# Patient Record
Sex: Male | Born: 1935
Health system: Southern US, Community
[De-identification: ages and names within clinical notes are randomized; demographics above are authoritative.]

## PROBLEM LIST (undated history)

## (undated) DIAGNOSIS — G459 Transient cerebral ischemic attack, unspecified: Secondary | ICD-10-CM

## (undated) DIAGNOSIS — R569 Unspecified convulsions: Secondary | ICD-10-CM

## (undated) DIAGNOSIS — H9190 Unspecified hearing loss, unspecified ear: Secondary | ICD-10-CM

## (undated) DIAGNOSIS — E785 Hyperlipidemia, unspecified: Secondary | ICD-10-CM

## (undated) HISTORY — DX: Unspecified convulsions: R56.9

## (undated) HISTORY — DX: Unspecified hearing loss, unspecified ear: H91.90

## (undated) HISTORY — DX: Hyperlipidemia, unspecified: E78.5

---

## 2019-02-26 LAB — HEPATIC FUNCTION PANEL
ALT: 17 (ref 10–40)
AST: 20 (ref 14–40)
Alkaline Phosphatase: 91 (ref 25–125)
Bilirubin, Total: 0.6

## 2019-02-26 LAB — BASIC METABOLIC PANEL
BUN: 21 (ref 4–21)
CO2: 30 — AB (ref 13–22)
Chloride: 105 (ref 99–108)
Creatinine: 1.1 (ref 0.6–1.3)
Glucose: 99
Potassium: 4.4 (ref 3.4–5.3)
Sodium: 141 (ref 137–147)

## 2019-02-26 LAB — LIPID PANEL
Cholesterol: 152 (ref 0–200)
HDL: 76 — AB (ref 35–70)
LDL Cholesterol: 65
Triglycerides: 55 (ref 40–160)

## 2019-02-26 LAB — COMPREHENSIVE METABOLIC PANEL
Albumin: 4.8 (ref 3.5–5.0)
Calcium: 9.8 (ref 8.7–10.7)

## 2020-03-27 LAB — LIPID PANEL
Cholesterol: 180 (ref 0–200)
HDL: 76 — AB (ref 35–70)
LDL Cholesterol: 82
Triglycerides: 109 (ref 40–160)

## 2020-09-07 ENCOUNTER — Encounter: Payer: Self-pay | Admitting: Family Medicine

## 2020-09-07 ENCOUNTER — Other Ambulatory Visit: Payer: Self-pay

## 2020-09-07 ENCOUNTER — Ambulatory Visit (INDEPENDENT_AMBULATORY_CARE_PROVIDER_SITE_OTHER): Payer: Medicare Other | Admitting: Family Medicine

## 2020-09-07 DIAGNOSIS — E785 Hyperlipidemia, unspecified: Secondary | ICD-10-CM | POA: Diagnosis not present

## 2020-09-07 DIAGNOSIS — H9190 Unspecified hearing loss, unspecified ear: Secondary | ICD-10-CM

## 2020-09-07 DIAGNOSIS — G40909 Epilepsy, unspecified, not intractable, without status epilepticus: Secondary | ICD-10-CM

## 2020-09-07 LAB — CBC
HCT: 39.6 % (ref 39.0–52.0)
Hemoglobin: 13.2 g/dL (ref 13.0–17.0)
MCHC: 33.4 g/dL (ref 30.0–36.0)
MCV: 91.3 fl (ref 78.0–100.0)
Platelets: 272 10*3/uL (ref 150.0–400.0)
RBC: 4.34 Mil/uL (ref 4.22–5.81)
RDW: 14.1 % (ref 11.5–15.5)
WBC: 7.1 10*3/uL (ref 4.0–10.5)

## 2020-09-07 LAB — COMPREHENSIVE METABOLIC PANEL
ALT: 14 U/L (ref 0–53)
AST: 18 U/L (ref 0–37)
Albumin: 4.6 g/dL (ref 3.5–5.2)
Alkaline Phosphatase: 81 U/L (ref 39–117)
BUN: 23 mg/dL (ref 6–23)
CO2: 31 mEq/L (ref 19–32)
Calcium: 10.4 mg/dL (ref 8.4–10.5)
Chloride: 102 mEq/L (ref 96–112)
Creatinine, Ser: 1.14 mg/dL (ref 0.40–1.50)
GFR: 56.67 mL/min — ABNORMAL LOW (ref >60.00)
Glucose, Bld: 98 mg/dL (ref 70–99)
Potassium: 4.9 mEq/L (ref 3.5–5.1)
Sodium: 139 mEq/L (ref 135–145)
Total Bilirubin: 0.4 mg/dL (ref 0.2–1.2)
Total Protein: 6.7 g/dL (ref 6.0–8.3)

## 2020-09-07 LAB — TSH: TSH: 5.45 u[IU]/mL — ABNORMAL HIGH (ref 0.35–4.50)

## 2020-09-07 LAB — LIPID PANEL
Cholesterol: 193 mg/dL (ref 0–200)
HDL: 65.8 mg/dL (ref >39.00)
LDL Cholesterol: 93 mg/dL (ref 0–99)
NonHDL: 127.37
Total CHOL/HDL Ratio: 3
Triglycerides: 170 mg/dL — ABNORMAL HIGH (ref 0.0–149.0)
VLDL: 34 mg/dL (ref 0.0–40.0)

## 2020-09-07 MED ORDER — LAMOTRIGINE 200 MG PO TABS: 200.0000 mg | ORAL_TABLET | Freq: Two times a day (BID) | ORAL | 3 refills | Status: DC

## 2020-09-07 MED ORDER — SIMVASTATIN 20 MG PO TABS: 20.0000 mg | ORAL_TABLET | Freq: Every day | ORAL | 3 refills | Status: DC

## 2020-09-07 NOTE — Assessment & Plan Note (Signed)
Stable without any recent seizures.  Given lack of side effects and good control we will continue Lamictal 200 mg twice daily.

## 2020-09-07 NOTE — Progress Notes (Signed)
   Brandon Gray is a 87 y.o. male who presents today for an office visit. He is a new patient.   Assessment/Plan:  Chronic Problems Addressed Today: Hearing loss Has some balance issues as well. Will place referral to ENT. Declined referral to PT today.   Dyslipidemia Check lipids. Continue simvastatin 20mg  daily.   Seizure disorder (HCC) Stable without any recent seizures.  Given lack of side effects and good control we will continue Lamictal 200 mg twice daily.  Return for next yearly visit in 1 year.    Subjective:  HPI:  See a/p.  PMH:  The following were reviewed and entered/updated in epic: Past Medical History:  Diagnosis Date  . Hyperlipidemia   . Seizures [facility])    Patient Active Problem List   Diagnosis Date Noted  . Seizure disorder (HCC) 09/07/2020  . Dyslipidemia 09/07/2020  . Hearing loss 09/07/2020   History reviewed. No pertinent surgical history.  Family History  Problem Relation Age of Onset  . Diabetes Father   . Cancer Daughter   . Early death Daughter     Medications- reviewed and updated Current Outpatient Medications  Medication Sig Dispense Refill  . lamoTRIgine (LAMICTAL) 200 MG tablet Take 1 tablet (200 mg total) by mouth 2 (two) times daily. 180 tablet 3  . simvastatin (ZOCOR) 20 MG tablet Take 1 tablet (20 mg total) by mouth daily. 90 tablet 3   No current facility-administered medications for this visit.    Allergies-reviewed and updated No Known Allergies  Social History   Socioeconomic History  . Marital status: Widowed    Spouse name: Not on file  . Number of children: Not on file  . Years of education: Not on file  . Highest education level: Not on file  Occupational History  . Not on file  Tobacco Use  . Smoking status: Never Smoker  . Smokeless tobacco: Not on file  Substance and Sexual Activity  . Alcohol use: Yes    Alcohol/week: 7.0 standard drinks    Types: 7 Glasses of wine per week  . Drug use:  Never  . Sexual activity: Not Currently  Other Topics Concern  . Not on file  Social History Narrative  . Not on file   Social Determinants of Health   Financial Resource Strain: Not on file  Food Insecurity: Not on file  Transportation Needs: Not on file  Physical Activity: Not on file  Stress: Not on file  Social Connections: Not on file           Objective:  Physical Exam: BP (!) 142/65   Pulse 72   Temp 98.2 F (36.8 C) (Temporal)   Ht 5\' 4"  (1.626 m)   Wt 145 lb 3.2 oz (65.9 kg)   SpO2 98%   BMI 24.92 kg/m   Gen: No acute distress, resting comfortably CV: Regular rate and rhythm with no murmurs appreciated Pulm: Normal work of breathing, clear to auscultation bilaterally with no crackles, wheezes, or rhonchi Neuro: Grossly normal, moves all extremities Psych: Normal affect and thought content      Kenric Ginger M. 09/09/2020, MD 09/07/2020 11:33 AM

## 2020-09-07 NOTE — Patient Instructions (Addendum)
It was very nice to see you today!  We will check blood work today.  I will refill your medications.  We will place a referral for you to see an ear doctor.  Please come back to see me in a year for your annual checkup.  Please come back to see me sooner if needed.  Take care, Dr Jimmey Ralph  Please try these tips to maintain a healthy lifestyle:   Eat at least 3 REAL meals and 1-2 snacks per day.  Aim for no more than 5 hours between eating.  If you eat breakfast, please do so within one hour of getting up.    Each meal should contain half fruits/vegetables, one quarter protein, and one quarter carbs (no bigger than a computer mouse)   Cut down on sweet beverages. This includes juice, soda, and sweet tea.     Drink at least 1 glass of water with each meal and aim for at least 8 glasses per day   Exercise at least 150 minutes every week.

## 2020-09-07 NOTE — Assessment & Plan Note (Signed)
Check lipids.  Continue simvastatin 20 mg daily. ?

## 2020-09-07 NOTE — Assessment & Plan Note (Signed)
Has some balance issues as well. Will place referral to ENT. Declined referral to PT today.

## 2020-09-08 NOTE — Progress Notes (Signed)
Please inform patient of the following:  Labs are all within acceptable ranges. Do not need to make any changes to his treatment plan at this time. Would like for him to keep up the good work and we can recheck in a year.  Katina Degree. Jimmey Ralph, MD 09/08/2020 11:15 AM

## 2020-09-24 ENCOUNTER — Ambulatory Visit (INDEPENDENT_AMBULATORY_CARE_PROVIDER_SITE_OTHER): Payer: Medicare Other | Admitting: Otolaryngology

## 2020-10-01 ENCOUNTER — Encounter: Payer: Self-pay | Admitting: Family Medicine

## 2020-10-07 ENCOUNTER — Other Ambulatory Visit: Payer: Self-pay

## 2020-10-07 ENCOUNTER — Ambulatory Visit (INDEPENDENT_AMBULATORY_CARE_PROVIDER_SITE_OTHER): Payer: Medicare Other | Admitting: Otolaryngology

## 2020-10-07 VITALS — Temp 97.3°F

## 2020-10-07 DIAGNOSIS — H6123 Impacted cerumen, bilateral: Secondary | ICD-10-CM

## 2020-10-07 DIAGNOSIS — H903 Sensorineural hearing loss, bilateral: Secondary | ICD-10-CM | POA: Diagnosis not present

## 2020-10-07 NOTE — Progress Notes (Signed)
HPI: Brandon Gray is a 87 y.o. male who presents is referred by Dr. Jimmey Ralph for evaluation of hearing loss.  Patient just recently moved here from [place] to live with his son.  He has bilateral hearing aids and has had hearing loss for a number of years.  He last got his hearing aids couple years ago.  He presents today to have his ears checked and cleaned..  Past Medical History:  Diagnosis Date  . Hearing loss   . Hyperlipidemia   . Seizures (HCC)    No past surgical history on file. Social History   Socioeconomic History  . Marital status: Widowed    Spouse name: Not on file  . Number of children: Not on file  . Years of education: Not on file  . Highest education level: Not on file  Occupational History  . Not on file  Tobacco Use  . Smoking status: Never Smoker  . Smokeless tobacco: Not on file  Substance and Sexual Activity  . Alcohol use: Yes    Alcohol/week: 7.0 standard drinks    Types: 7 Glasses of wine per week  . Drug use: Never  . Sexual activity: Not Currently  Other Topics Concern  . Not on file  Social History Narrative  . Not on file   Social Determinants of Health   Financial Resource Strain: Not on file  Food Insecurity: Not on file  Transportation Needs: Not on file  Physical Activity: Not on file  Stress: Not on file  Social Connections: Not on file   Family History  Problem Relation Age of Onset  . Diabetes Father   . Cancer Daughter   . Early death Daughter    No Known Allergies Prior to Admission medications   Medication Sig Start Date End Date Taking? Authorizing Provider  lamoTRIgine (LAMICTAL) 200 MG tablet Take 1 tablet (200 mg total) by mouth 2 (two) times daily. 09/07/20   Ardith Dark, MD  simvastatin (ZOCOR) 20 MG tablet Take 1 tablet (20 mg total) by mouth daily. 09/07/20   Ardith Dark, MD     Positive ROS: Otherwise negative  All other systems have been reviewed and were otherwise negative with the exception  of those mentioned in the HPI and as above.  Physical Exam: Constitutional: Alert, well-appearing, no acute distress Ears: External ears without lesions or tenderness. Ear canals with a mild amount of wax buildup in both ear canals that was cleaned with forceps and curettes.  Both TMs were clear otherwise. Nasal: External nose without lesions.. Clear nasal passages Oral: Lips and gums without lesions. Tongue and palate mucosa without lesions. Posterior oropharynx clear. Neck: No palpable adenopathy or masses Respiratory: Breathing comfortably  Skin: No facial/neck lesions or rash noted.  Cerumen impaction removal  Date/Time: 10/07/2020 11:34 AM Performed by: Drema Halon, MD Authorized by: Drema Halon, MD   Consent:    Consent obtained:  Verbal   Consent given by:  Patient   Risks discussed:  Pain and bleeding Procedure details:    Location:  L ear and R ear   Procedure type: curette and forceps   Post-procedure details:    Inspection:  TM intact and canal normal   Hearing quality:  Improved   Patient tolerance of procedure:  Tolerated well, no immediate complications Comments:     TMs are clear bilaterally.    Assessment: Wax buildup in both ears and patient who wears hearing aids.  Plan: He will follow-up with our  audiologist concerning audiologic testing and whether he needs upgrade of his hearing aids.   Narda Bonds, MD   CC:

## 2020-10-27 DIAGNOSIS — D1801 Hemangioma of skin and subcutaneous tissue: Secondary | ICD-10-CM | POA: Diagnosis not present

## 2020-10-27 DIAGNOSIS — L578 Other skin changes due to chronic exposure to nonionizing radiation: Secondary | ICD-10-CM | POA: Diagnosis not present

## 2020-10-27 DIAGNOSIS — L570 Actinic keratosis: Secondary | ICD-10-CM | POA: Diagnosis not present

## 2020-10-27 DIAGNOSIS — L821 Other seborrheic keratosis: Secondary | ICD-10-CM | POA: Diagnosis not present

## 2020-12-02 ENCOUNTER — Encounter: Payer: Self-pay | Admitting: Family Medicine

## 2020-12-02 ENCOUNTER — Ambulatory Visit (INDEPENDENT_AMBULATORY_CARE_PROVIDER_SITE_OTHER): Payer: Medicare Other

## 2020-12-02 ENCOUNTER — Ambulatory Visit (INDEPENDENT_AMBULATORY_CARE_PROVIDER_SITE_OTHER): Payer: Medicare Other | Admitting: Family Medicine

## 2020-12-02 ENCOUNTER — Other Ambulatory Visit: Payer: Self-pay

## 2020-12-02 VITALS — BP 138/73 | HR 71 | Temp 98.0°F | Ht 64.0 in | Wt 143.2 lb

## 2020-12-02 DIAGNOSIS — K5900 Constipation, unspecified: Secondary | ICD-10-CM

## 2020-12-02 DIAGNOSIS — G40909 Epilepsy, unspecified, not intractable, without status epilepticus: Secondary | ICD-10-CM

## 2020-12-02 IMAGING — DX DG ABDOMEN 1V
1 series · 1 of 1 positions shown · non-contrast
Comparison: None.

CLINICAL DATA: Constipation

EXAM:
ABDOMEN - 1 VIEW

[kub ap]
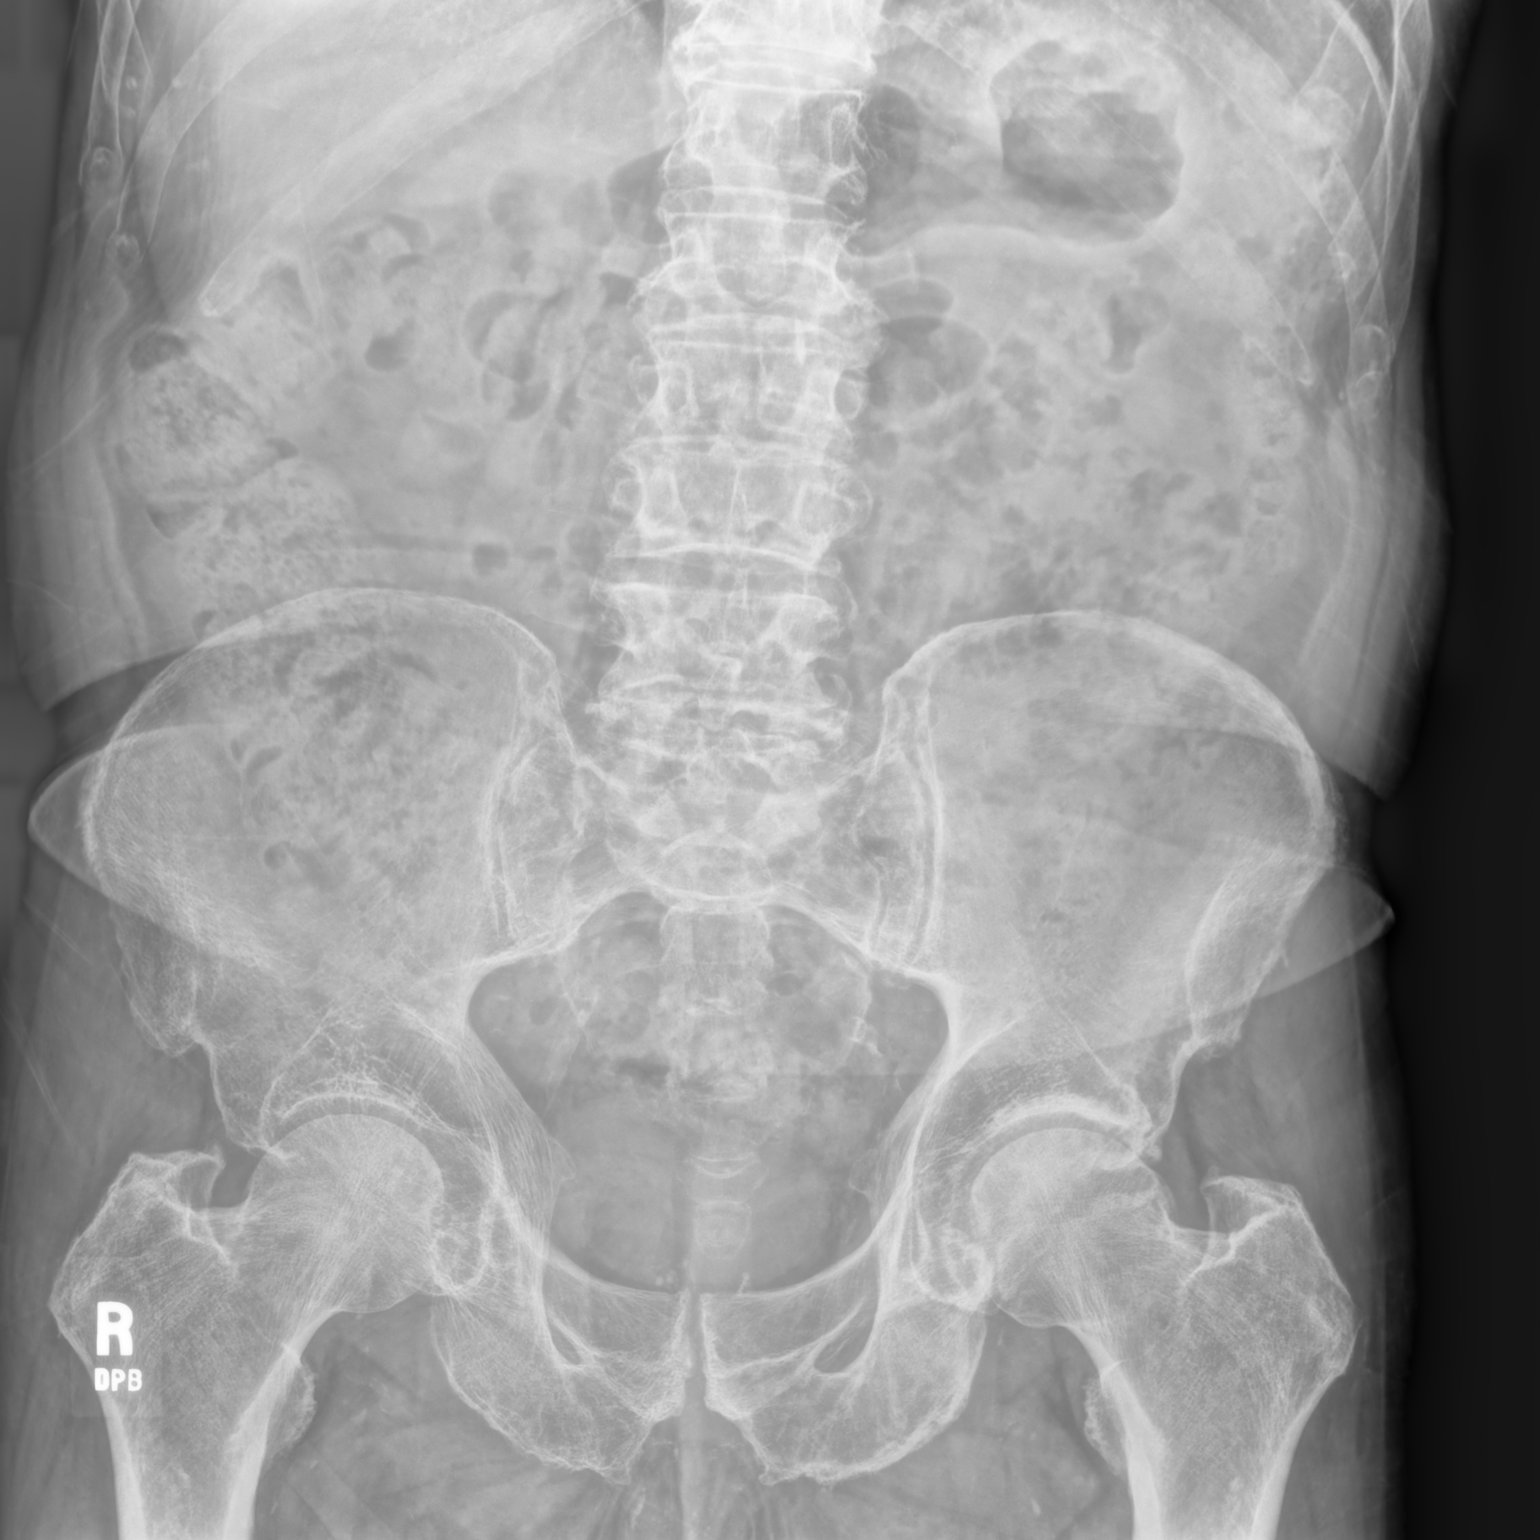

[1 of 1 positions shown; findings below may reference images not displayed]

FINDINGS: Normal bowel gas pattern. There is stool throughout the colon. No
acute osseous abnormality.
IMPRESSION: Normal bowel gas pattern.  Moderate stool throughout the colon.

## 2020-12-02 NOTE — Patient Instructions (Signed)
It was very nice to see you today!  You are constipated.  I think this is causing most of your problems.  Please take a dose of MiraLAX daily to have at least 1-2 soft bowel movements daily.  Please send a message in a couple of weeks if your symptoms are not improving.  You have benign cyst on your penis.  This is nothing to worry about.  Please let me know if it changes.  Take care, Dr Jimmey Ralph  PLEASE NOTE:  If you had any lab tests please let us know if you have not heard back within a few days. You may see your results on mychart before we have a chance to review them but we will give you a call once they are reviewed by [place]. If we ordered any referrals today, please let us know if you have not heard from their office within the next week.   Please try these tips to maintain a healthy lifestyle:   Eat at least 3 REAL meals and 1-2 snacks per day.  Aim for no more than 5 hours between eating.  If you eat breakfast, please do so within one hour of getting up.    Each meal should contain half fruits/vegetables, one quarter protein, and one quarter carbs (no bigger than a computer mouse)   Cut down on sweet beverages. This includes juice, soda, and sweet tea.     Drink at least 1 glass of water with each meal and aim for at least 8 glasses per day   Exercise at least 150 minutes every week.

## 2020-12-02 NOTE — Progress Notes (Signed)
   Brandon Gray is a 87 y.o. male who presents today for an office visit.  Assessment/Plan:  New/Acute Problems: Constipation No red flags.  Reassuring abdominal exam.  KUB shows constipation based on my read.  Will await radiology read.  We will start bowel regimen with MiraLAX daily.  Encourage good oral hydration and fiber supplement as well.  He will follow-up with me in a couple weeks if symptoms or not improving  Penile lesion Consistent with benign cyst.  Reassured patient.  Do not need to any further treatment at this point.  Chronic Problems Addressed Today: Seizure disorder (HCC) On Lamictal 200 mg twice daily.  Could be contributing to his constipation.  We will start bowel regimen as above.  May consider stopping will need goal of continues to have issues with constipation or other side effects.     Subjective:  HPI:  Patient here with frequent stools for the last several months.  History provided by patient and his son.  Symptoms have been worsening.  Has also had left lower quadrant abdominal pain for the last several weeks to month or so.  He does note that stools have been progressively hard to pass.  When he has a bowel movement he usually passes several small round stool balls.  Also has bloating after he eats.  No reported melena or hematochezia.  No specific treatments tried.  He has been under a lot of stress recently due to the passing of his wife about 5 months ago.  He also has a lesion on his penis that he would like to have looked at today.        Objective:  Physical Exam: BP 138/73   Pulse 71   Temp 98 F (36.7 C)   Ht 5\' 4"  (1.626 m)   Wt 143 lb 4 oz (65 kg)   SpO2 98%   BMI 24.59 kg/m   Gen: No acute distress, resting comfortably CV: Regular rate and rhythm with no murmurs appreciated Pulm: Normal work of breathing, clear to auscultation bilaterally with no crackles, wheezes, or rhonchi GU: Small pearly cyst on inferior penile  shaft. Neuro: Grossly normal, moves all extremities Psych: Normal affect and thought content  Time Spent: 41 minutes of total time was spent on the date of the encounter performing the following actions: chart review prior to seeing the patient, obtaining history, performing a medically necessary exam, counseling on the treatment plan including how regimen of MiraLAX, placing orders, reviewing his KUB in person with the patient and his son, and documenting in our EHR.        . Katina Degree, MD 12/02/2020 11:18 AM

## 2020-12-02 NOTE — Assessment & Plan Note (Signed)
On Lamictal 200 mg twice daily.  Could be contributing to his constipation.  We will start bowel regimen as above.  May consider stopping will need goal of continues to have issues with constipation or other side effects.

## 2021-02-05 ENCOUNTER — Ambulatory Visit (INDEPENDENT_AMBULATORY_CARE_PROVIDER_SITE_OTHER): Payer: Medicare Other

## 2021-02-05 ENCOUNTER — Other Ambulatory Visit: Payer: Self-pay

## 2021-02-05 VITALS — BP 138/76 | HR 78 | Temp 98.4°F | Wt 140.6 lb

## 2021-02-05 DIAGNOSIS — Z0000 Encounter for general adult medical examination without abnormal findings: Secondary | ICD-10-CM | POA: Diagnosis not present

## 2021-02-05 NOTE — Progress Notes (Signed)
Subjective:   Brandon Gray is a 87 y.o. male who presents for Medicare Annual/Subsequent preventive examination.along with son Brandon Gray   Review of Systems     Cardiac Risk Factors include: advanced age (>2men, >50 women);male gender;dyslipidemia     Objective:    Today's Vitals   02/05/21 1008  BP: (!) 152/63  Pulse: 78  Temp: 98.4 F (36.9 C)  SpO2: 99%  Weight: 140 lb 9.6 oz (63.8 kg)   Body mass index is 24.13 kg/m.  Advanced Directives 02/05/2021  Does Patient Have a Medical Advance Directive? Yes  Type of Advance Directive Living will    Current Medications (verified) Outpatient Encounter Medications as of 02/05/2021  Medication Sig   lamoTRIgine (LAMICTAL) 200 MG tablet Take 1 tablet (200 mg total) by mouth 2 (two) times daily.   polyethylene glycol (MIRALAX / GLYCOLAX) 17 g packet Take 17 g by mouth daily.   simvastatin (ZOCOR) 20 MG tablet Take 1 tablet (20 mg total) by mouth daily.   No facility-administered encounter medications on file as of 02/05/2021.    Allergies (verified) Patient has no known allergies.   History: Past Medical History:  Diagnosis Date   Hearing loss    Hyperlipidemia    Seizures (HCC)    History reviewed. No pertinent surgical history. Family History  Problem Relation Age of Onset   Diabetes Father    Cancer Daughter    Early death Daughter    Social History   Socioeconomic History   Marital status: Widowed    Spouse name: Not on file   Number of children: Not on file   Years of education: Not on file   Highest education level: Not on file  Occupational History   Not on file  Tobacco Use   Smoking status: Never   Smokeless tobacco: Never  Substance and Sexual Activity   Alcohol use: Yes    Alcohol/week: 7.0 standard drinks    Types: 7 Glasses of wine per week   Drug use: Never   Sexual activity: Not Currently  Other Topics Concern   Not on file  Social History Narrative   Not on file   Social  Determinants of Health   Financial Resource Strain: Low Risk    Difficulty of Paying Living Expenses: Not hard at all  Food Insecurity: No Food Insecurity   Worried About Programme researcher, broadcasting/film/video in the Last Year: Never true   Ran Out of Food in the Last Year: Never true  Transportation Needs: No Transportation Needs   Lack of Transportation (Medical): No   Lack of Transportation (Non-Medical): No  Physical Activity: Inactive   Days of Exercise per Week: 0 days   Minutes of Exercise per Session: 0 min  Stress: No Stress Concern Present   Feeling of Stress : Not at all  Social Connections: Socially Isolated   Frequency of Communication with Friends and Family: More than three times a week   Frequency of Social Gatherings with Friends and Family: Once a week   Attends Religious Services: Never   Database administrator or Organizations: No   Attends Banker Meetings: Never   Marital Status: Widowed    Tobacco Counseling Counseling given: Not Answered   Clinical Intake:  Pre-visit preparation completed: Yes  Pain : No/denies pain     BMI - recorded: 24.13 Nutritional Status: BMI of 19-24  Normal Nutritional Risks: None Diabetes: No  How often do you need to have someone help you  when you read instructions, pamphlets, or other written materials from your doctor or pharmacy?: 1 - Never  Diabetic?no  Interpreter Needed?: No  Information entered by :: Lanier Ensign, LPN   Activities of Daily Living In your present state of health, do you have any difficulty performing the following activities: 02/05/2021 09/07/2020  Hearing? Malvin Johns  Comment wears hearing aids -  Vision? Y N  Comment need to see large print -  Difficulty concentrating or making decisions? N Y  Walking or climbing stairs? N Y  Comment - use walker  Dressing or bathing? N N  Doing errands, shopping? N N  Preparing Food and eating ? N -  Using the Toilet? N -  In the past six months, have you  accidently leaked urine? N -  Do you have problems with loss of bowel control? N -  Managing your Medications? N -  Managing your Finances? N -  Housekeeping or managing your Housekeeping? N -    Patient Care Team: Ardith Dark, MD as PCP - General (Family Medicine)  Indicate any recent Medical Services you may have received from other than [facility] providers in the past year (date may be approximate).     Assessment:   This is a routine wellness examination for [place].  Hearing/Vision screen Hearing Screening - Comments:: Wears hearing aids  Vision Screening - Comments:: Pt encouraged to follow up with eye exams   Dietary issues and exercise activities discussed: Current Exercise Habits: The patient does not participate in regular exercise at present   Goals Addressed             This Visit's Progress    Patient Stated       Stay healthy        Depression Screen PHQ 2/9 Scores 02/05/2021 09/07/2020  PHQ - 2 Score 1 0    Fall Risk Fall Risk  02/05/2021  Falls in the past year? 1  Number falls in past yr: 1  Injury with Fall? 1  Risk for fall due to : Impaired balance/gait;Impaired vision  Follow up Falls prevention discussed    FALL RISK PREVENTION PERTAINING TO THE HOME:  Any stairs in or around the home? No  If so, are there any without handrails? No  Home free of loose throw rugs in walkways, pet beds, electrical Gray, etc? Yes  Adequate lighting in your home to reduce risk of falls? Yes   ASSISTIVE DEVICES UTILIZED TO PREVENT FALLS:  Life alert? Yes  Use of a cane, walker or w/c? Yes  Grab bars in the bathroom? Yes  Shower chair or bench in shower? Yes  Elevated toilet seat or a handicapped toilet? Yes   TIMED UP AND GO:  Was the test performed? Yes .  Length of time to ambulate 10 feet: 15 sec.   Gait steady and fast with assistive device  Cognitive Function:     6CIT Screen 02/05/2021  What Year? 4 points  What month? 0 points  What  time? 0 points  Count back from 20 0 points  Months in reverse 4 points  Repeat phrase (No Data)    Immunizations Immunization History  Administered Date(s) Administered   Influenza Split 05/30/2017   Influenza-Unspecified 06/15/2001, 06/17/2002, 06/18/2003, 06/30/2004, 07/13/2005, 05/24/2006, 05/21/2007, 05/14/2008, 06/24/2010, 07/03/2012, 07/01/2013, 06/02/2014, 06/02/2015, 06/15/2020   Moderna Sars-Covid-2 Vaccination 08/29/2019, 09/25/2019, 07/15/2020   Pneumococcal Conjugate-13 06/02/2015   Pneumococcal-Unspecified 01/22/1999    TDAP status: Due, Education has been provided regarding the  importance of this vaccine. Advised may receive this vaccine at local pharmacy or Health Dept. Aware to provide a copy of the vaccination record if obtained from local pharmacy or Health Dept. Verbalized acceptance and understanding.  Flu Vaccine status: Up to date  Pneumococcal vaccine status: Up to date  Covid-19 vaccine status: Completed vaccines  Qualifies for Shingles Vaccine? Yes   Zostavax completed No   Shingrix Completed?: No.    Education has been provided regarding the importance of this vaccine. Patient has been advised to call insurance company to determine out of pocket expense if they have not yet received this vaccine. Advised may also receive vaccine at local pharmacy or Health Dept. Verbalized acceptance and understanding.  Screening Tests Health Maintenance  Topic Date Due   TETANUS/TDAP  Never done   Zoster Vaccines- Shingrix (1 of 2) Never done   COVID-19 Vaccine (4 - Booster for Moderna series) 11/13/2020   INFLUENZA VACCINE  03/15/2021   PNA vac Low Risk Adult  Completed   HPV VACCINES  Aged Out    Health Maintenance  Health Maintenance Due  Topic Date Due   TETANUS/TDAP  Never done   Zoster Vaccines- Shingrix (1 of 2) Never done   COVID-19 Vaccine (4 - Booster for Moderna series) 11/13/2020    Colorectal cancer screening: No longer required.     Additional Screening:  Vision Screening: Recommended annual ophthalmology exams for early detection of glaucoma and other disorders of the eye. Is the patient up to date with their annual eye exam?  No  Who is the provider or what is the name of the office in which the patient attends annual eye exams? [facility] If pt is not established with a provider, would they like to be referred to a provider to establish care? No .   Dental Screening: Recommended annual dental exams for proper oral hygiene  Community Resource Referral / Chronic Care Management: CRR required this visit?  No   CCM required this visit?  No      Plan:     I have personally reviewed and noted the following in the patient's chart:   Medical and social history Use of alcohol, tobacco or illicit drugs  Current medications and supplements including opioid prescriptions. Patient is not currently taking opioid prescriptions. Functional ability and status Nutritional status Physical activity Advanced directives List of other physicians Hospitalizations, surgeries, and ER visits in previous 12 months Vitals Screenings to include cognitive, depression, and falls Referrals and appointments  In addition, I have reviewed and discussed with patient certain preventive protocols, quality metrics, and best practice recommendations. A written personalized care plan for preventive services as well as general preventive health recommendations were provided to patient.     Marzella Schlein, LPN   6/72/0947   Nurse Notes: Pt has an appt with Alyssa on Monday at 11:30 to address both feet and left thigh numbness per pt complaint.

## 2021-02-05 NOTE — Patient Instructions (Signed)
Brandon Gray , Thank you for taking time to come for your Medicare Wellness Visit. I appreciate your ongoing commitment to your health goals. Please review the following plan we discussed and let me know if I can assist you in the future.   Screening recommendations/referrals: Colonoscopy: No longer required Recommended yearly ophthalmology/optometry visit for glaucoma screening and checkup Recommended yearly dental visit for hygiene and checkup  Vaccinations: Influenza vaccine: Due 03/15/21 Pneumococcal vaccine: Completed  Tdap vaccine: Due and discussed Shingles vaccine: Shingrix discussed. Please contact your pharmacy for coverage information.    Covid-19: Completed 1/14, 2/10, and 07/15/20  Advanced directives: Please bring a copy of your health care power of attorney and living will to the office at your convenience.  Conditions/risks identified: None at this time   Next appointment: Follow up in one year for your annual wellness visit.   Preventive Care 85 Years and Older, Male Preventive care refers to lifestyle choices and visits with your health care provider that can promote health and wellness. What does preventive care include? A yearly physical exam. This is also called an annual well check. Dental exams once or twice a year. Routine eye exams. Ask your health care provider how often you should have your eyes checked. Personal lifestyle choices, including: Daily care of your teeth and gums. Regular physical activity. Eating a healthy diet. Avoiding tobacco and drug use. Limiting alcohol use. Practicing safe sex. Taking low doses of aspirin every day. Taking vitamin and mineral supplements as recommended by your health care provider. What happens during an annual well check? The services and screenings done by your health care provider during your annual well check will depend on your age, overall health, lifestyle risk factors, and family history of  disease. Counseling  Your health care provider may ask you questions about your: Alcohol use. Tobacco use. Drug use. Emotional well-being. Home and relationship well-being. Sexual activity. Eating habits. History of falls. Memory and ability to understand (cognition). Work and work Astronomer. Screening  You may have the following tests or measurements: Height, weight, and BMI. Blood pressure. Lipid and cholesterol levels. These may be checked every 5 years, or more frequently if you are over 65 years old. Skin check. Lung cancer screening. You may have this screening every year starting at age 85 if you have a 30-pack-year history of smoking and currently smoke or have quit within the past 15 years. Fecal occult blood test (FOBT) of the stool. You may have this test every year starting at age 85. Flexible sigmoidoscopy or colonoscopy. You may have a sigmoidoscopy every 5 years or a colonoscopy every 10 years starting at age 85. Prostate cancer screening. Recommendations will vary depending on your family history and other risks. Hepatitis C blood test. Hepatitis B blood test. Sexually transmitted disease (STD) testing. Diabetes screening. This is done by checking your blood sugar (glucose) after you have not eaten for a while (fasting). You may have this done every 1-3 years. Abdominal aortic aneurysm (AAA) screening. You may need this if you are a current or former smoker. Osteoporosis. You may be screened starting at age 85 if you are at high risk. Talk with your health care provider about your test results, treatment options, and if necessary, the need for more tests. Vaccines  Your health care provider may recommend certain vaccines, such as: Influenza vaccine. This is recommended every year. Tetanus, diphtheria, and acellular pertussis (Tdap, Td) vaccine. You may need a Td booster every 10 years. Zoster vaccine.  You may need this after age 21. Pneumococcal 13-valent  conjugate (PCV13) vaccine. One dose is recommended after age 8. Pneumococcal polysaccharide (PPSV23) vaccine. One dose is recommended after age 45. Talk to your health care provider about which screenings and vaccines you need and how often you need them. This information is not intended to replace advice given to you by your health care provider. Make sure you discuss any questions you have with your health care provider. Document Released: 08/28/2015 Document Revised: 04/20/2016 Document Reviewed: 06/02/2015 Elsevier Interactive Patient Education  2017 [employer] Prevention in the Home Falls can cause injuries. They can happen to people of all ages. There are many things you can do to make your home safe and to help prevent falls. What can I do on the outside of my home? Regularly fix the edges of walkways and driveways and fix any cracks. Remove anything that might make you trip as you walk through a door, such as a raised step or threshold. Trim any bushes or trees on the path to your home. Use bright outdoor lighting. Clear any walking paths of anything that might make someone trip, such as rocks or tools. Regularly check to see if handrails are loose or broken. Make sure that both sides of any steps have handrails. Any raised decks and porches should have guardrails on the edges. Have any leaves, snow, or ice cleared regularly. Use sand or salt on walking paths during winter. Clean up any spills in your garage right away. This includes oil or grease spills. What can I do in the bathroom? Use night lights. Install grab bars by the toilet and in the tub and shower. Do not use towel bars as grab bars. Use non-skid mats or decals in the tub or shower. If you need to sit down in the shower, use a plastic, non-slip stool. Keep the floor dry. Clean up any water that spills on the floor as soon as it happens. Remove soap buildup in the tub or shower regularly. Attach bath mats  securely with double-sided non-slip rug tape. Do not have throw rugs and other things on the floor that can make you trip. What can I do in the bedroom? Use night lights. Make sure that you have a light by your bed that is easy to reach. Do not use any sheets or blankets that are too big for your bed. They should not hang down onto the floor. Have a firm chair that has side arms. You can use this for support while you get dressed. Do not have throw rugs and other things on the floor that can make you trip. What can I do in the kitchen? Clean up any spills right away. Avoid walking on wet floors. Keep items that you use a lot in easy-to-reach places. If you need to reach something above you, use a strong step stool that has a grab bar. Keep electrical cords out of the way. Do not use floor polish or wax that makes floors slippery. If you must use wax, use non-skid floor wax. Do not have throw rugs and other things on the floor that can make you trip. What can I do with my stairs? Do not leave any items on the stairs. Make sure that there are handrails on both sides of the stairs and use them. Fix handrails that are broken or loose. Make sure that handrails are as long as the stairways. Check any carpeting to make sure that it is  firmly attached to the stairs. Fix any carpet that is loose or worn. Avoid having throw rugs at the top or bottom of the stairs. If you do have throw rugs, attach them to the floor with carpet tape. Make sure that you have a light switch at the top of the stairs and the bottom of the stairs. If you do not have them, ask someone to add them for you. What else can I do to help prevent falls? Wear shoes that: Do not have high heels. Have rubber bottoms. Are comfortable and fit you well. Are closed at the toe. Do not wear sandals. If you use a stepladder: Make sure that it is fully opened. Do not climb a closed stepladder. Make sure that both sides of the stepladder  are locked into place. Ask someone to hold it for you, if possible. Clearly mark and make sure that you can see: Any grab bars or handrails. First and last steps. Where the edge of each step is. Use tools that help you move around (mobility aids) if they are needed. These include: Canes. Walkers. Scooters. Crutches. Turn on the lights when you go into a dark area. Replace any light bulbs as soon as they burn out. Set up your furniture so you have a clear path. Avoid moving your furniture around. If any of your floors are uneven, fix them. If there are any pets around you, be aware of where they are. Review your medicines with your doctor. Some medicines can make you feel dizzy. This can increase your chance of falling. Ask your doctor what other things that you can do to help prevent falls. This information is not intended to replace advice given to you by your health care provider. Make sure you discuss any questions you have with your health care provider. Document Released: 05/28/2009 Document Revised: 01/07/2016 Document Reviewed: 09/05/2014 Elsevier Interactive Patient Education  2017 [employer].

## 2021-02-08 ENCOUNTER — Ambulatory Visit (INDEPENDENT_AMBULATORY_CARE_PROVIDER_SITE_OTHER): Payer: Medicare Other | Admitting: Physician Assistant

## 2021-02-08 ENCOUNTER — Other Ambulatory Visit: Payer: Self-pay

## 2021-02-08 ENCOUNTER — Encounter: Payer: Self-pay | Admitting: Physician Assistant

## 2021-02-08 VITALS — BP 166/63 | HR 77 | Temp 98.1°F | Ht 64.0 in | Wt 141.2 lb

## 2021-02-08 DIAGNOSIS — I739 Peripheral vascular disease, unspecified: Secondary | ICD-10-CM | POA: Diagnosis not present

## 2021-02-08 DIAGNOSIS — M25552 Pain in left hip: Secondary | ICD-10-CM | POA: Diagnosis not present

## 2021-02-08 NOTE — Progress Notes (Signed)
Acute Office Visit  Subjective:    Patient ID: Brandon Gray, male    DOB: 1928-03-17, 87 y.o.   MRN: [id number]  Chief Complaint  Patient presents with   Numbness    Thighs   Leg Swelling    Left leg worse   Skin Discoloration    Feet    HPI Patient is in today for several concerns.  He is with his son, who is doing most of the talking and interpretation, as patient is very HOH.  Patient tells his son that the side of his left thigh has been feeling numb.  Son thinks this has been a constant issue from what his father has told him, but when we clarify in the office today it sounds like he is only having trouble in the evening times as he is rolling over in bed or lifting his leg to get into the bed.  It is not preventing him from ambulation, nor is it bothering him during the day.  He states that he is still able to sleep on that side at night.  He says that it just hurts him and feels numb briefly as he turns over in bed and then it is gone.  He does take a Tylenol PM at night.  He has also been telling his son that he feels like his left leg has been more swollen.  He denies any pain or redness.  He has no chest pain or shortness of breath.  He does not have a history of clots.  There is also concern about the coldness to his ankles and feet, as well as pronounced blood vessels, worse on his right than his left.  He does not smoke.   Past Medical History:  Diagnosis Date   Hearing loss    Hyperlipidemia    Seizures (HCC)     History reviewed. No pertinent surgical history.  Family History  Problem Relation Age of Onset   Diabetes Father    Cancer Daughter    Early death Daughter     Social History   Socioeconomic History   Marital status: Widowed    Spouse name: Not on file   Number of children: Not on file   Years of education: Not on file   Highest education level: Not on file  Occupational History   Not on file  Tobacco Use   Smoking status: Never    Smokeless tobacco: Never  Substance and Sexual Activity   Alcohol use: Yes    Alcohol/week: 7.0 standard drinks    Types: 7 Glasses of wine per week   Drug use: Never   Sexual activity: Not Currently  Other Topics Concern   Not on file  Social History Narrative   Not on file   Social Determinants of Health   Financial Resource Strain: Low Risk    Difficulty of Paying Living Expenses: Not hard at all  Food Insecurity: No Food Insecurity   Worried About Programme researcher, broadcasting/film/video in the Last Year: Never true   Ran Out of Food in the Last Year: Never true  Transportation Needs: No Transportation Needs   Lack of Transportation (Medical): No   Lack of Transportation (Non-Medical): No  Physical Activity: Inactive   Days of Exercise per Week: 0 days   Minutes of Exercise per Session: 0 min  Stress: No Stress Concern Present   Feeling of Stress : Not at all  Social Connections: Socially Isolated   Frequency of Communication with  Friends and Family: More than three times a week   Frequency of Social Gatherings with Friends and Family: Once a week   Attends Religious Services: Never   Database administrator or Organizations: No   Attends Banker Meetings: Never   Marital Status: Widowed  Catering manager Violence: Not At Risk   Fear of Current or Ex-Partner: No   Emotionally Abused: No   Physically Abused: No   Sexually Abused: No    Outpatient Medications Prior to Visit  Medication Sig Dispense Refill   lamoTRIgine (LAMICTAL) 200 MG tablet Take 1 tablet (200 mg total) by mouth 2 (two) times daily. 180 tablet 3   polyethylene glycol (MIRALAX / GLYCOLAX) 17 g packet Take 17 g by mouth daily.     simvastatin (ZOCOR) 20 MG tablet Take 1 tablet (20 mg total) by mouth daily. 90 tablet 3   No facility-administered medications prior to visit.    No Known Allergies  Review of Systems REFER TO HPI FOR PERTINENT POSITIVES AND NEGATIVES     Objective:    Physical  Exam Patient is using a walker.  He is alert and oriented x3.  He has good strength and range of motion in his hips.  He is not tender along the trochanteric bursa.  He is not tender with palpation along his L upper thigh.  His sensation is intact. He does not have any swelling in his lower extremities.  DP and PT pulses bilaterally very faint but present.  His ankles and feet are cool to the touch.  Capillary refill is greater than 3 seconds.  BP (!) 166/63   Pulse 77   Temp 98.1 F (36.7 C)   Ht 5\' 4"  (1.626 m)   Wt 141 lb 4 oz (64.1 kg)   SpO2 97%   BMI 24.25 kg/m  Wt Readings from Last 3 Encounters:  02/08/21 141 lb 4 oz (64.1 kg)  02/05/21 140 lb 9.6 oz (63.8 kg)  12/02/20 143 lb 4 oz (65 kg)    Health Maintenance Due  Topic Date Due   TETANUS/TDAP  Never done   Zoster Vaccines- Shingrix (1 of 2) Never done   COVID-19 Vaccine (4 - Booster for Moderna series) 11/13/2020    There are no preventive care reminders to display for this patient.   Lab Results  Component Value Date   TSH 5.45 (H) 09/07/2020   Lab Results  Component Value Date   WBC 7.1 09/07/2020   HGB 13.2 09/07/2020   HCT 39.6 09/07/2020   MCV 91.3 09/07/2020   PLT 272.0 09/07/2020   Lab Results  Component Value Date   NA 139 09/07/2020   K 4.9 09/07/2020   CO2 31 09/07/2020   GLUCOSE 98 09/07/2020   BUN 23 09/07/2020   CREATININE 1.14 09/07/2020   BILITOT 0.4 09/07/2020   ALKPHOS 81 09/07/2020   AST 18 09/07/2020   ALT 14 09/07/2020   PROT 6.7 09/07/2020   ALBUMIN 4.6 09/07/2020   CALCIUM 10.4 09/07/2020   GFR 56.67 (L) 09/07/2020   Lab Results  Component Value Date   CHOL 193 09/07/2020   Lab Results  Component Value Date   HDL 65.80 09/07/2020   Lab Results  Component Value Date   LDLCALC 93 09/07/2020   Lab Results  Component Value Date   TRIG 170.0 (H) 09/07/2020   Lab Results  Component Value Date   CHOLHDL 3 09/07/2020   No results found for: HGBA1C  Assessment  & Plan:   Problem List Items Addressed This Visit   None    No orders of the defined types were placed in this encounter.  1. Peripheral vascular disease (HCC) We will send orders for bilateral ABI and plan for referral to vascular and vein if necessary.  Reassured today that I do not think he has a blood clot.  2. Left hip pain No obvious cause identified on exam.  I suspect he may have some arthritis that is bothering him at night or early trochanteric bursitis.  I think the Tylenol PM is fine to continue if this is helping him.  Patient and son agreed to that further work-up at this time can wait until if and when this problem becomes more of a constant issue or starts to affect his ambulation.  This note was prepared with assistance of Conservation officer, historic buildings. Occasional wrong-word or sound-a-like substitutions may have occurred due to the inherent limitations of voice recognition software.  Total time spent on this encounter including documentation, physical exam, discussion with patient and son, was 39 minutes.  Teddie Curd M Gloriajean Okun, PA-C

## 2021-02-08 NOTE — Patient Instructions (Addendum)
Someone will call to schedule a test called ABI.

## 2021-02-19 ENCOUNTER — Other Ambulatory Visit: Payer: Self-pay

## 2021-02-19 ENCOUNTER — Ambulatory Visit (HOSPITAL_COMMUNITY)
Admission: RE | Admit: 2021-02-19 | Discharge: 2021-02-19 | Disposition: A | Discharge status: Home / Self Care | Payer: Medicare Other | Source: Ambulatory Visit | Attending: Physician Assistant | Admitting: Physician Assistant

## 2021-02-19 DIAGNOSIS — I739 Peripheral vascular disease, unspecified: Secondary | ICD-10-CM | POA: Diagnosis not present

## 2021-02-22 ENCOUNTER — Telehealth: Payer: Self-pay

## 2021-02-22 ENCOUNTER — Other Ambulatory Visit: Payer: Self-pay

## 2021-02-22 DIAGNOSIS — I739 Peripheral vascular disease, unspecified: Secondary | ICD-10-CM

## 2021-02-22 NOTE — Telephone Encounter (Signed)
Lmtcb.

## 2021-02-22 NOTE — Telephone Encounter (Signed)
Patient's son missed a call about lab results for Brandon Gray. Can someone call him back?

## 2021-02-23 ENCOUNTER — Other Ambulatory Visit: Payer: Self-pay

## 2021-02-23 ENCOUNTER — Emergency Department (HOSPITAL_BASED_OUTPATIENT_CLINIC_OR_DEPARTMENT_OTHER)
Admission: EM | Admit: 2021-02-23 | Discharge: 2021-02-23 | Disposition: A | Discharge status: Home / Self Care | Payer: Medicare Other | Attending: Emergency Medicine | Admitting: Emergency Medicine

## 2021-02-23 ENCOUNTER — Encounter (HOSPITAL_BASED_OUTPATIENT_CLINIC_OR_DEPARTMENT_OTHER): Payer: Self-pay | Admitting: Emergency Medicine

## 2021-02-23 ENCOUNTER — Emergency Department (HOSPITAL_COMMUNITY): Payer: Medicare Other

## 2021-02-23 ENCOUNTER — Emergency Department (HOSPITAL_BASED_OUTPATIENT_CLINIC_OR_DEPARTMENT_OTHER): Payer: Medicare Other

## 2021-02-23 DIAGNOSIS — R2689 Other abnormalities of gait and mobility: Secondary | ICD-10-CM | POA: Diagnosis not present

## 2021-02-23 DIAGNOSIS — R269 Unspecified abnormalities of gait and mobility: Secondary | ICD-10-CM | POA: Diagnosis not present

## 2021-02-23 DIAGNOSIS — R4781 Slurred speech: Secondary | ICD-10-CM | POA: Insufficient documentation

## 2021-02-23 DIAGNOSIS — Z20822 Contact with and (suspected) exposure to covid-19: Secondary | ICD-10-CM | POA: Diagnosis not present

## 2021-02-23 DIAGNOSIS — R2681 Unsteadiness on feet: Secondary | ICD-10-CM

## 2021-02-23 DIAGNOSIS — R200 Anesthesia of skin: Secondary | ICD-10-CM | POA: Diagnosis not present

## 2021-02-23 DIAGNOSIS — R531 Weakness: Secondary | ICD-10-CM | POA: Insufficient documentation

## 2021-02-23 DIAGNOSIS — W19XXXA Unspecified fall, initial encounter: Secondary | ICD-10-CM | POA: Diagnosis not present

## 2021-02-23 DIAGNOSIS — M47812 Spondylosis without myelopathy or radiculopathy, cervical region: Secondary | ICD-10-CM | POA: Diagnosis not present

## 2021-02-23 DIAGNOSIS — Z043 Encounter for examination and observation following other accident: Secondary | ICD-10-CM | POA: Diagnosis not present

## 2021-02-23 LAB — CBG MONITORING, ED: Glucose-Capillary: 95 mg/dL (ref 70–99)

## 2021-02-23 LAB — BASIC METABOLIC PANEL
Anion gap: 9 (ref 5–15)
BUN: 17 mg/dL (ref 8–23)
CO2: 28 mmol/L (ref 22–32)
Calcium: 9.9 mg/dL (ref 8.9–10.3)
Chloride: 103 mmol/L (ref 98–111)
Creatinine, Ser: 1.05 mg/dL (ref 0.61–1.24)
GFR, Estimated: >60 mL/min (ref >60)
Glucose, Bld: 102 mg/dL — ABNORMAL HIGH (ref 70–99)
Potassium: 4.2 mmol/L (ref 3.5–5.1)
Sodium: 140 mmol/L (ref 135–145)

## 2021-02-23 LAB — CBC
HCT: 40.8 % (ref 39.0–52.0)
Hemoglobin: 13.2 g/dL (ref 13.0–17.0)
MCH: 30.3 pg (ref 26.0–34.0)
MCHC: 32.4 g/dL (ref 30.0–36.0)
MCV: 93.6 fL (ref 80.0–100.0)
Platelets: 255 10*3/uL (ref 150–400)
RBC: 4.36 MIL/uL (ref 4.22–5.81)
RDW: 13.6 % (ref 11.5–15.5)
WBC: 9.7 10*3/uL (ref 4.0–10.5)
nRBC: 0 % (ref 0.0–0.2)

## 2021-02-23 LAB — RESP PANEL BY RT-PCR (FLU A&B, COVID) ARPGX2
Influenza A by PCR: NEGATIVE
Influenza B by PCR: NEGATIVE
SARS Coronavirus 2 by RT PCR: NEGATIVE

## 2021-02-23 LAB — URINALYSIS, ROUTINE W REFLEX MICROSCOPIC
Bilirubin Urine: NEGATIVE
Glucose, UA: NEGATIVE mg/dL
Hgb urine dipstick: NEGATIVE
Ketones, ur: NEGATIVE mg/dL
Leukocytes,Ua: NEGATIVE
Nitrite: NEGATIVE
Protein, ur: NEGATIVE mg/dL
Specific Gravity, Urine: 1.011 (ref 1.005–1.030)
pH: 7 (ref 5.0–8.0)

## 2021-02-23 IMAGING — CT CT HEAD W/O CM
4 series · 16 of 47 positions shown, 18 images · non-contrast
Comparison: None.

CLINICAL DATA: Altered mental status, unwitnessed fall

EXAM:
CT HEAD WITHOUT CONTRAST
CT CERVICAL SPINE WITHOUT CONTRAST
TECHNIQUE: Multidetector CT imaging of the head and cervical spine was
performed following the standard protocol without intravenous
contrast. Multiplanar CT image reconstructions of the cervical spine
were also generated.

[Series 2: head wo · axial · 0.43mm/px · z∈[-583,-468]mm · 7 of 31 slices shown, 9 images]
[im 4/31  brain]
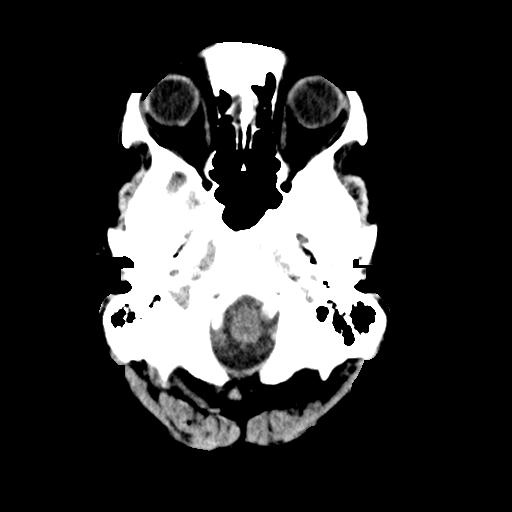
[im 4/31  bone]
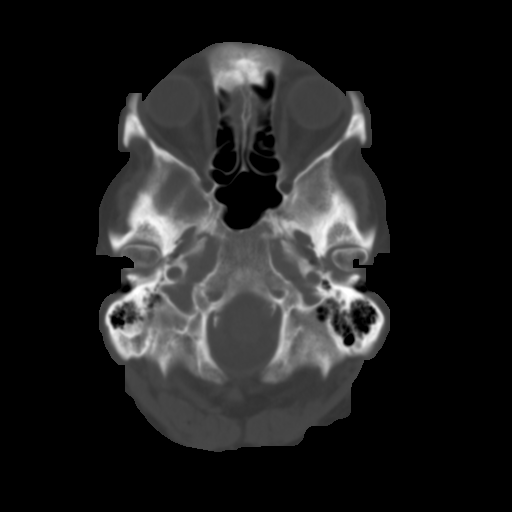
[im 8/31  brain]
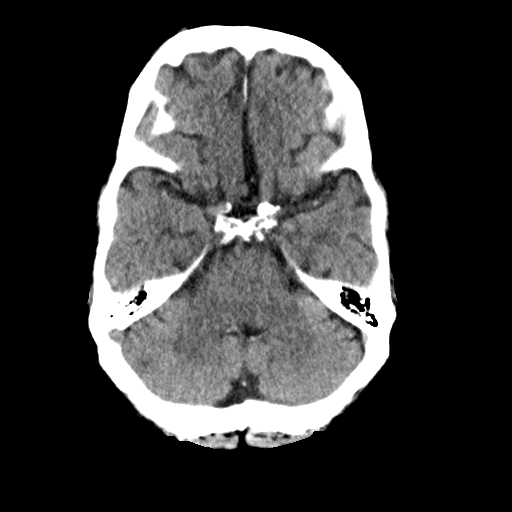
[im 12/31  brain]
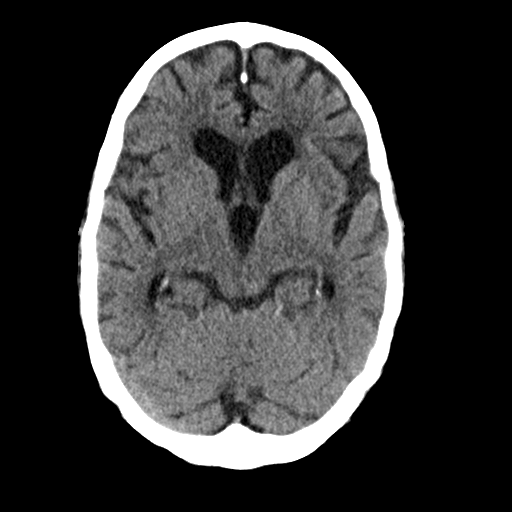
[im 16/31  brain]
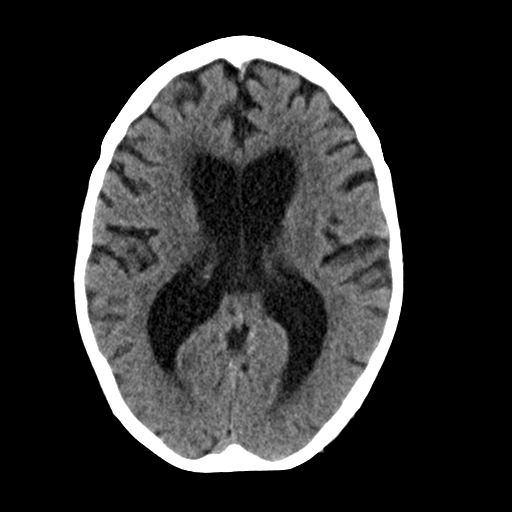
[im 19/31  brain]
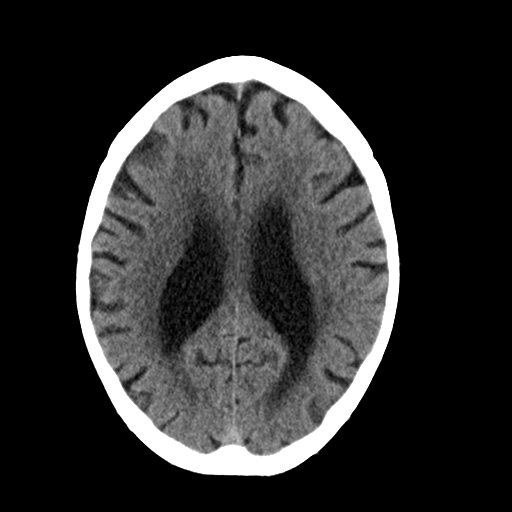
[im 19/31  bone]
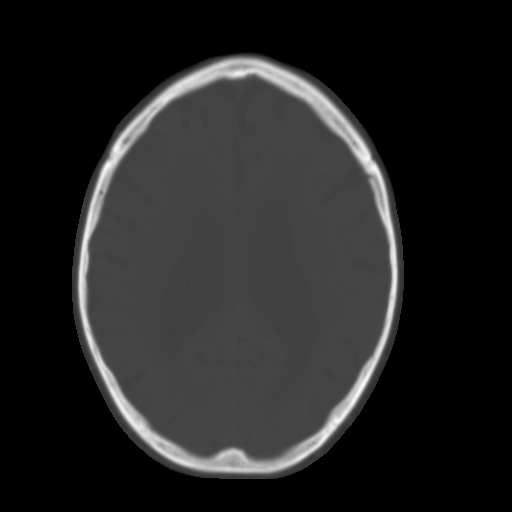
[im 23/31  brain]
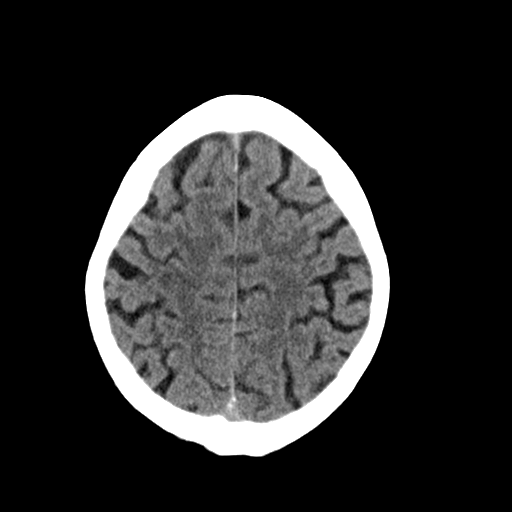
[im 27/31  brain]
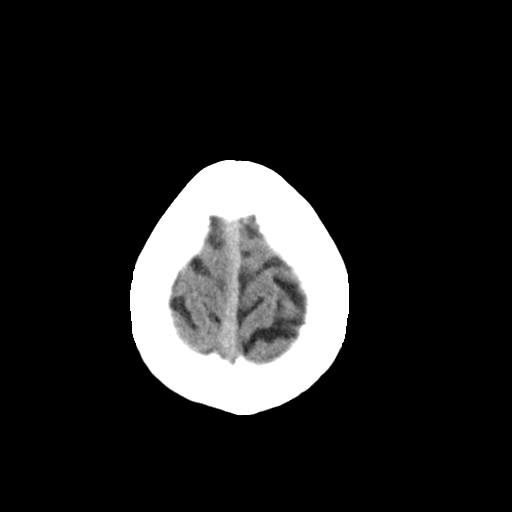

[Series 3: head bone · axial · 0.43mm/px · z∈[-584,-554]mm · 3 of 77 slices shown]
[im 8/77  bone]
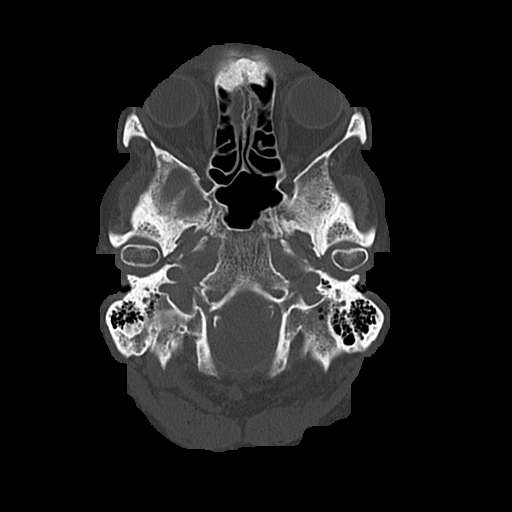
[im 16/77  bone]
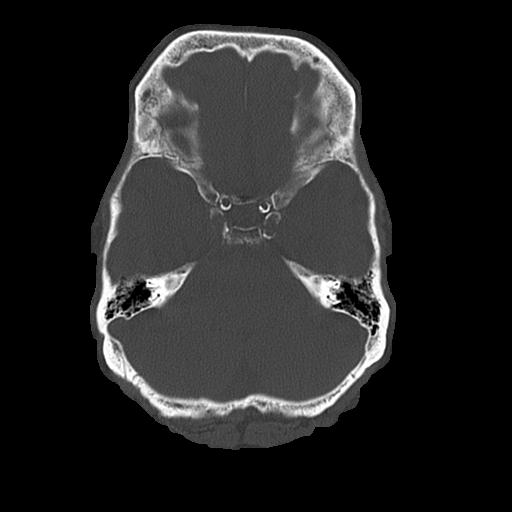
[im 23/77  bone]
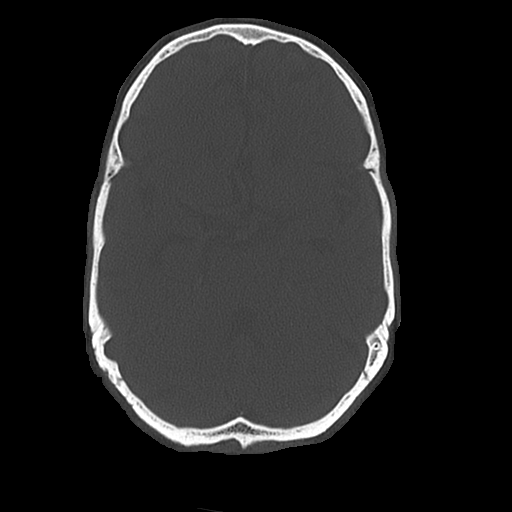

[Series 4: coronal soft · coronal · 0.33mm/px · 3 of 66 slices shown]
[im 22/66  brain]
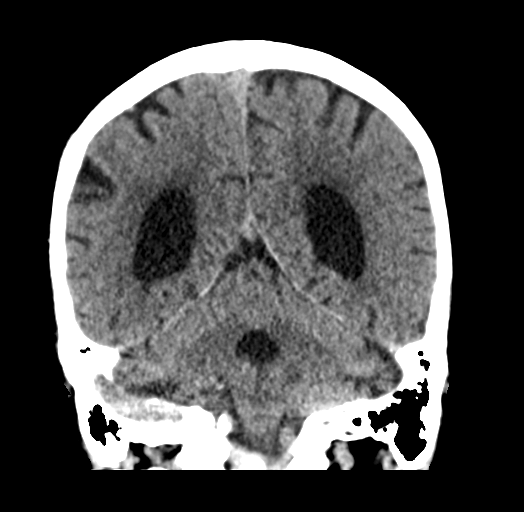
[im 29/66  brain]
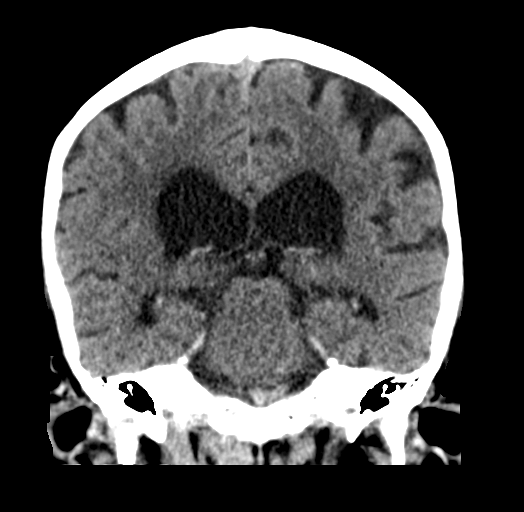
[im 37/66  brain]
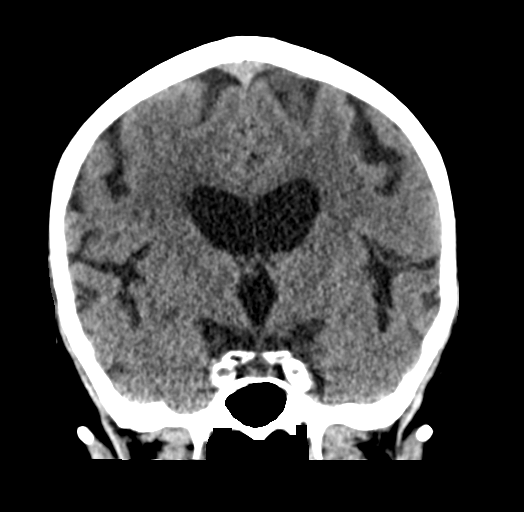

[Series 5: sagittal soft · sagittal · 0.33mm/px · 3 of 52 slices shown]
[im 18/52  brain]
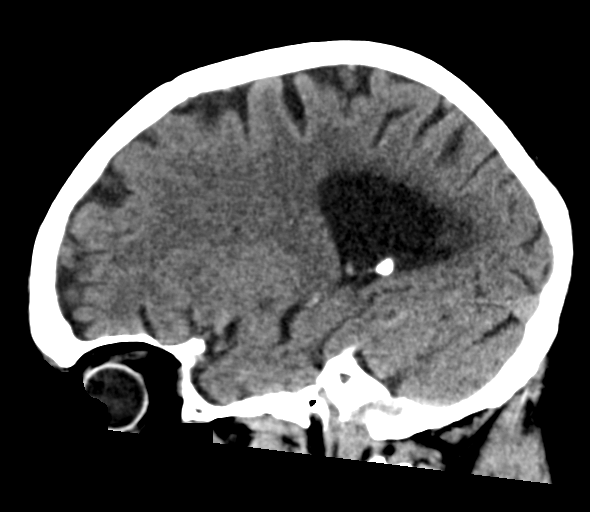
[im 26/52  brain]
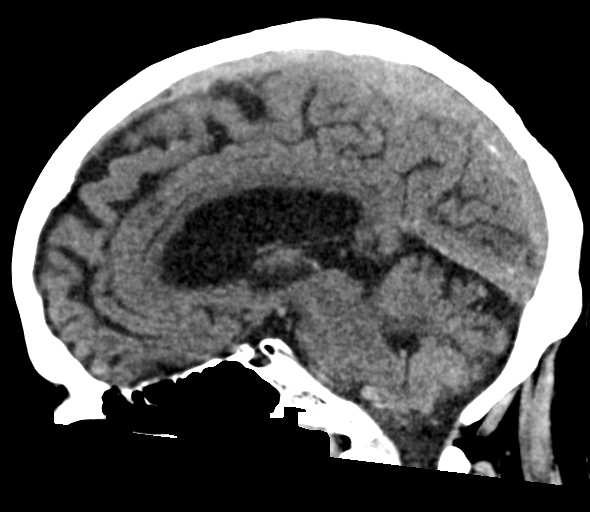
[im 35/52  brain]
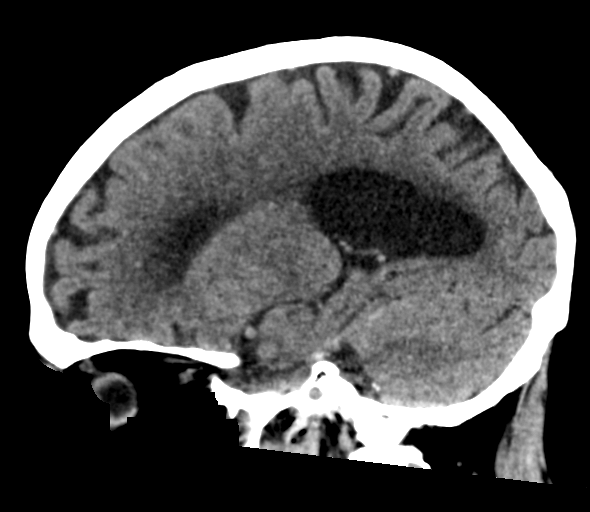

[16 of 47 positions shown; findings below may reference images not displayed]

FINDINGS: CT HEAD FINDINGS

Brain: No evidence of acute infarction, hemorrhage, hydrocephalus,
extra-axial collection or mass lesion/mass effect. Scattered
low-density changes within the periventricular and subcortical white
matter compatible with chronic microvascular ischemic change.
Mild-moderate diffuse cerebral volume loss.

Vascular: Atherosclerotic calcifications involving the large vessels
of the skull base. No unexpected hyperdense vessel.

Skull: Normal. Negative for fracture or focal lesion.

Sinuses/Orbits: No acute finding.

Other: Negative for scalp hematoma.

CT CERVICAL SPINE FINDINGS

Alignment: Facet joints are aligned without dislocation or traumatic
listhesis. Dens and lateral masses are aligned.

Skull base and vertebrae: No acute fracture. No primary bone lesion
or focal pathologic process.

Soft tissues and spinal canal: No prevertebral fluid or swelling. No
visible canal hematoma.

Disc levels: Mild degenerative disc disease most pronounced at C5-6
and C6-7. Multilevel uncovertebral spurring. Bulky multilevel facet
joint arthropathy, left worse than right.

Upper chest: Included lung apices are clear.

Other: Bilateral carotid atherosclerosis.
IMPRESSION: 1. No acute intracranial findings.
2. Chronic microvascular ischemic change and cerebral volume loss.
3. No evidence of acute fracture or traumatic listhesis of the
cervical spine.
4. Mild-moderate cervical spondylosis.

## 2021-02-23 IMAGING — CT CT CERVICAL SPINE W/O CM
3 of 4 series · 13 of 33 positions shown, 16 images · non-contrast
Comparison: None.

CLINICAL DATA: Altered mental status, unwitnessed fall

EXAM:
CT HEAD WITHOUT CONTRAST
CT CERVICAL SPINE WITHOUT CONTRAST
TECHNIQUE: Multidetector CT imaging of the head and cervical spine was
performed following the standard protocol without intravenous
contrast. Multiplanar CT image reconstructions of the cervical spine
were also generated.

[Series 5: cor bone · coronal · 0.43mm/px · 3 of 54 slices shown]
[im 12/54  bone]
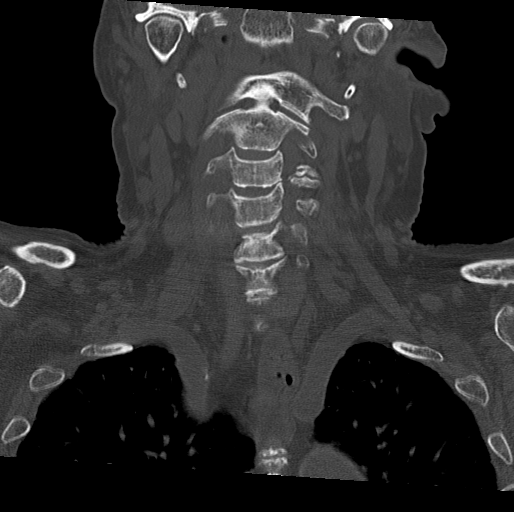
[im 22/54  bone]
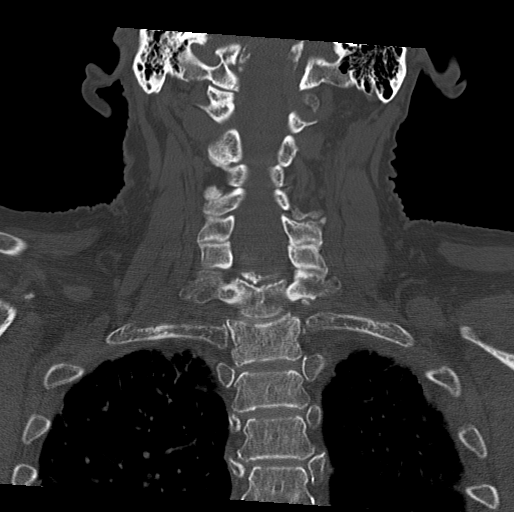
[im 32/54  bone]
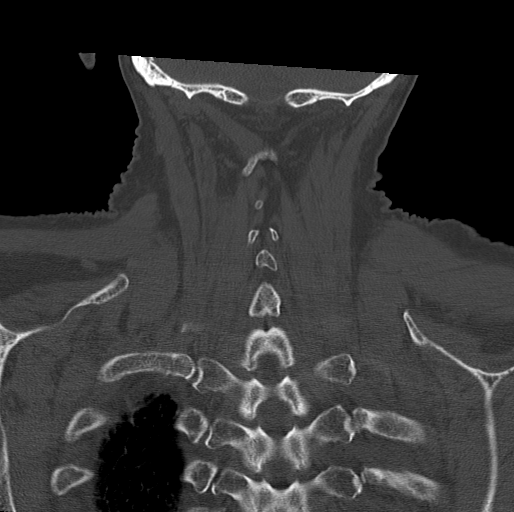

[Series 6: sag bone · sagittal · 0.28mm/px · 5 of 57 slices shown, 6 images]
[im 19/57  bone]
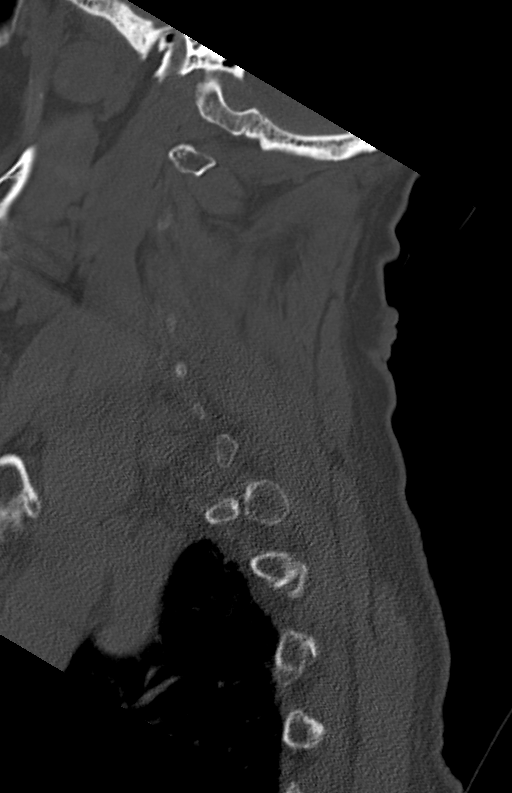
[im 24/57  bone]
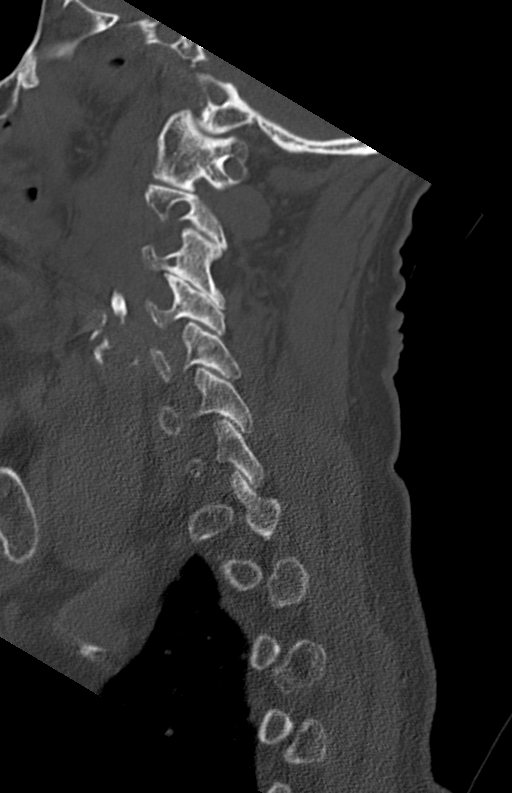
[im 29/57  soft-tissue]
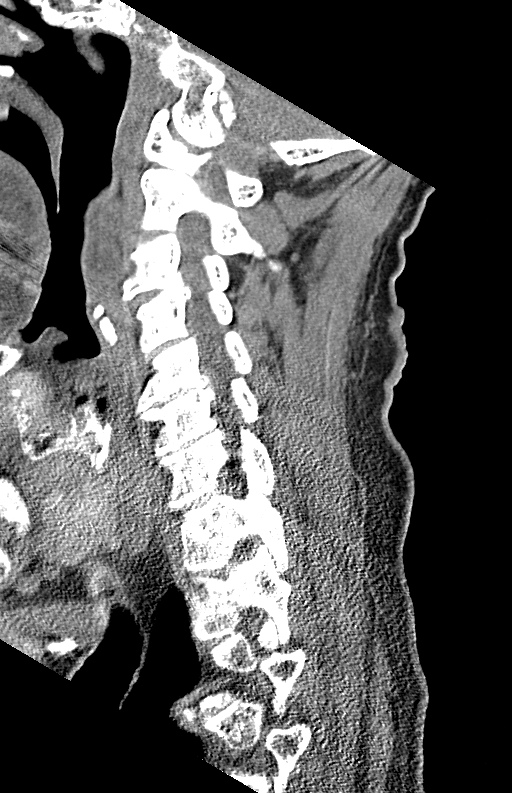
[im 29/57  bone]
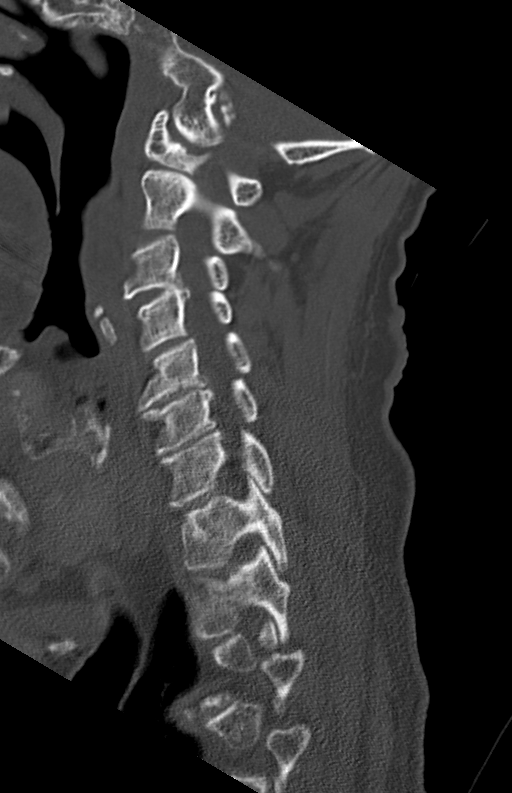
[im 33/57  bone]
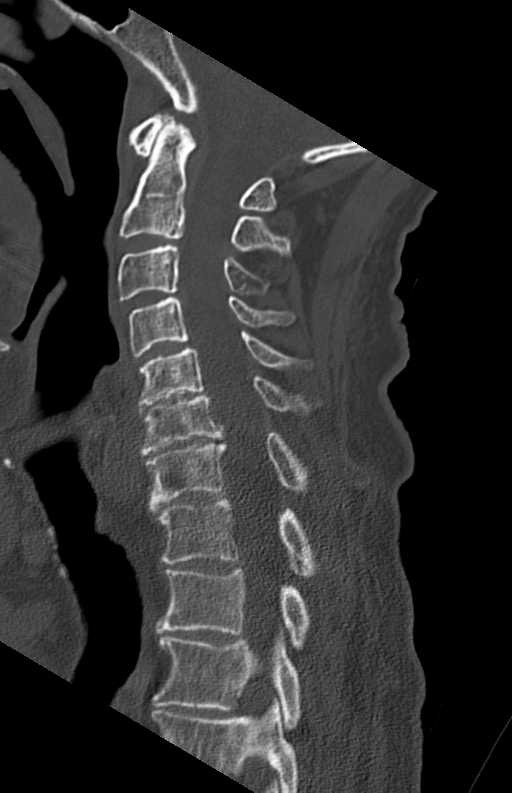
[im 38/57  bone]
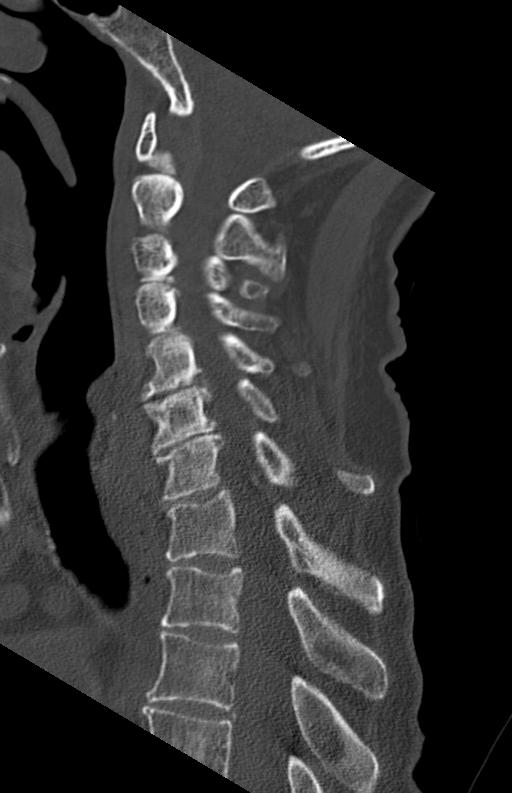

[Series 7: orthogonal axials · axial · 0.21mm/px · z∈[-753,-636]mm · 5 of 103 slices shown, 7 images]
[im 15/103  soft-tissue]
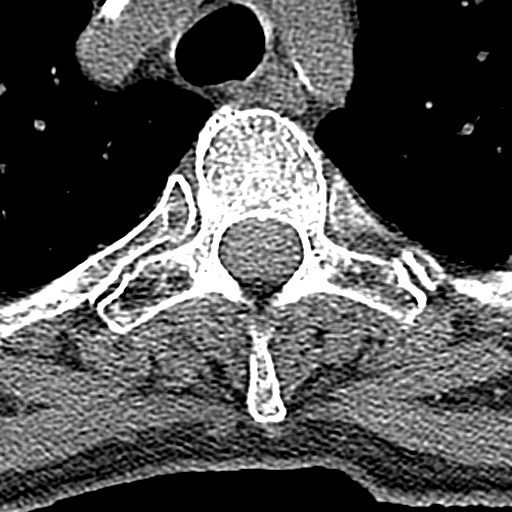
[im 15/103  bone]
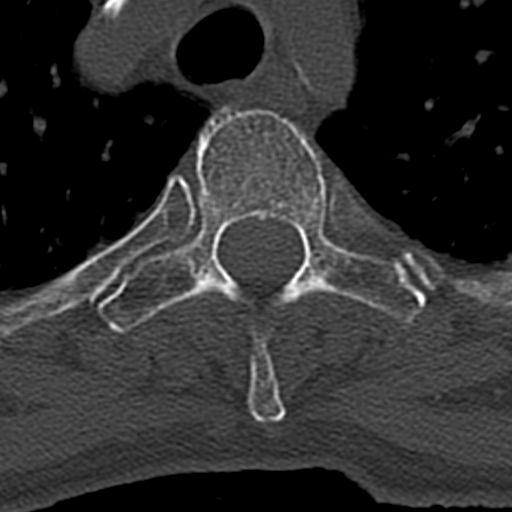
[im 30/103  bone]
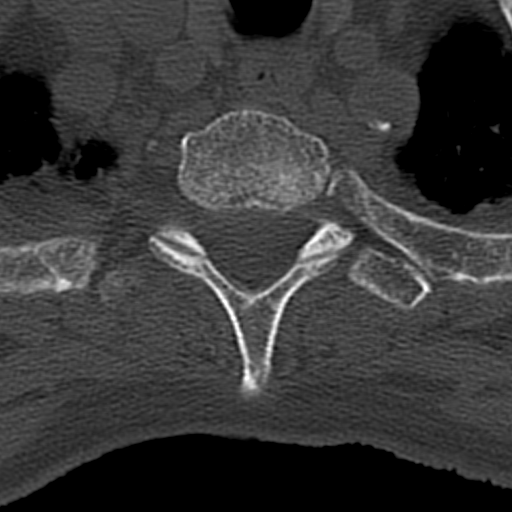
[im 59/103  bone]
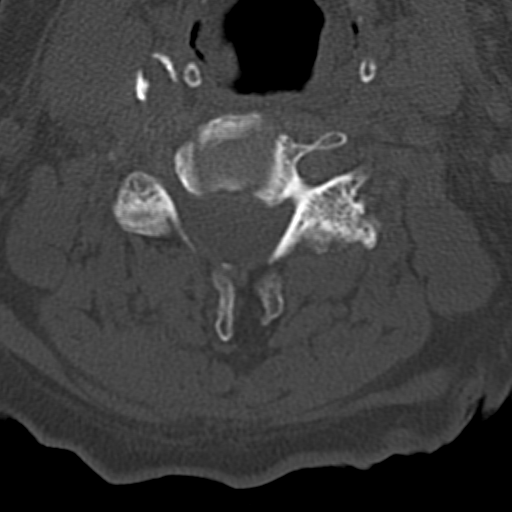
[im 73/103  bone]
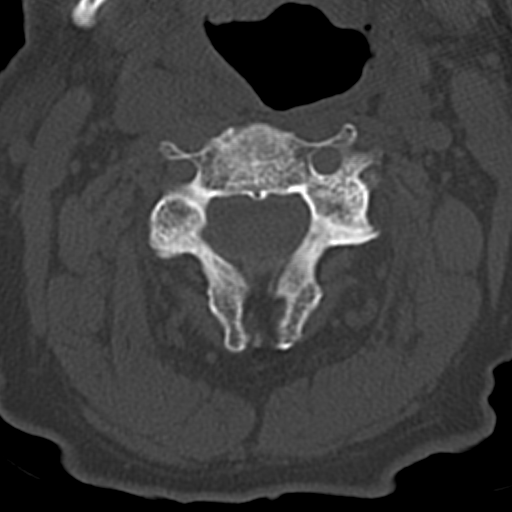
[im 88/103  soft-tissue]
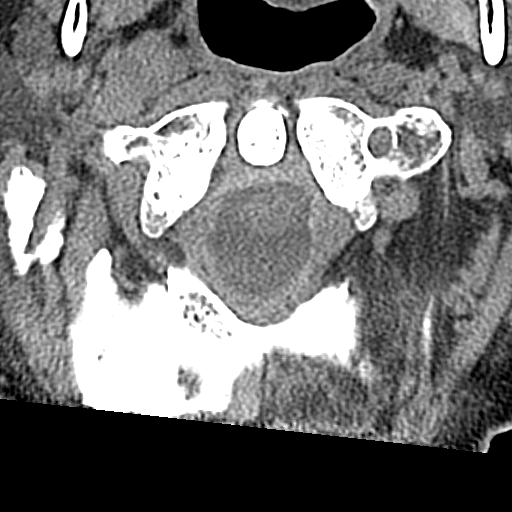
[im 88/103  bone]
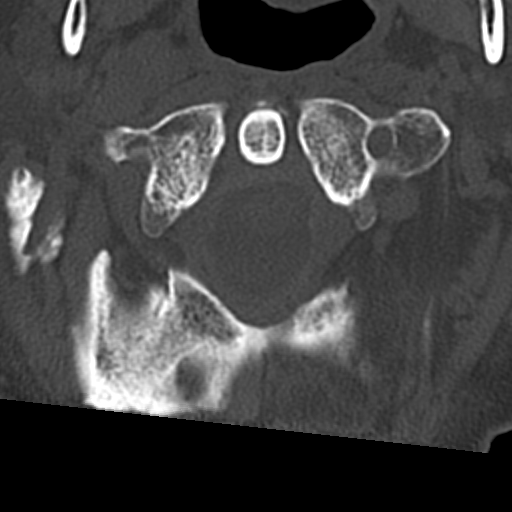

[13 of 33 positions shown; findings below may reference images not displayed]

FINDINGS: CT HEAD FINDINGS

Brain: No evidence of acute infarction, hemorrhage, hydrocephalus,
extra-axial collection or mass lesion/mass effect. Scattered
low-density changes within the periventricular and subcortical white
matter compatible with chronic microvascular ischemic change.
Mild-moderate diffuse cerebral volume loss.

Vascular: Atherosclerotic calcifications involving the large vessels
of the skull base. No unexpected hyperdense vessel.

Skull: Normal. Negative for fracture or focal lesion.

Sinuses/Orbits: No acute finding.

Other: Negative for scalp hematoma.

CT CERVICAL SPINE FINDINGS

Alignment: Facet joints are aligned without dislocation or traumatic
listhesis. Dens and lateral masses are aligned.

Skull base and vertebrae: No acute fracture. No primary bone lesion
or focal pathologic process.

Soft tissues and spinal canal: No prevertebral fluid or swelling. No
visible canal hematoma.

Disc levels: Mild degenerative disc disease most pronounced at C5-6
and C6-7. Multilevel uncovertebral spurring. Bulky multilevel facet
joint arthropathy, left worse than right.

Upper chest: Included lung apices are clear.

Other: Bilateral carotid atherosclerosis.
IMPRESSION: 1. No acute intracranial findings.
2. Chronic microvascular ischemic change and cerebral volume loss.
3. No evidence of acute fracture or traumatic listhesis of the
cervical spine.
4. Mild-moderate cervical spondylosis.

## 2021-02-23 IMAGING — MR MR HEAD W/O CM
12 of 13 series · 44 of 48 positions shown · non-contrast
Comparison: None.

CLINICAL DATA: Weakness and gait instability

EXAM:
MRI HEAD WITHOUT CONTRAST
TECHNIQUE: Multiplanar, multiecho pulse sequences of the brain and surrounding
structures were obtained without intravenous contrast.

[Series 5: DWI · axial · 3.0mm · 0.88mm/px · z∈[-114,+34]mm · 8 of 104 slices shown (1 of 4)]
[im 1/104]
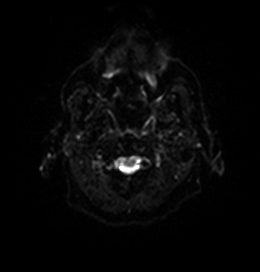
[im 15/104]
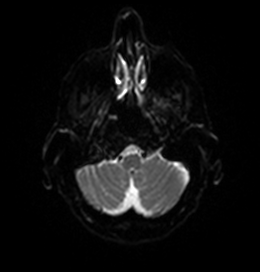
[im 30/104]
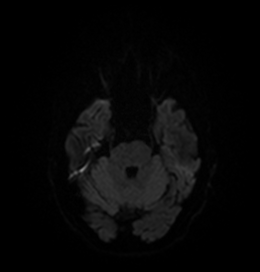
[im 45/104]
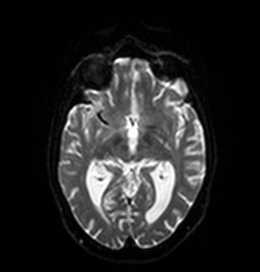
[im 59/104]
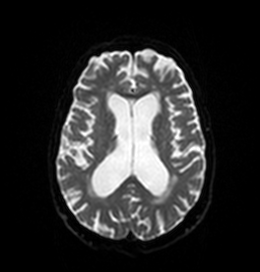
[im 74/104]
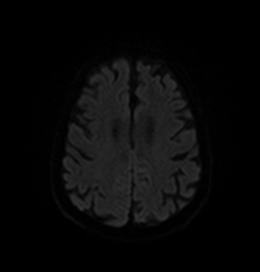
[im 89/104]
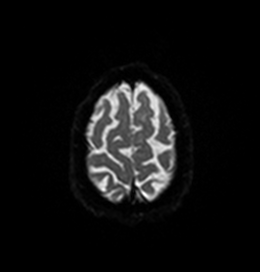
[im 104/104]
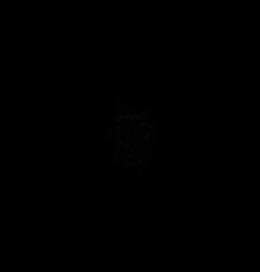

[Series 6: DWI · axial · 3.0mm · 0.88mm/px · z∈[-114,+34]mm · 4 of 52 slices shown (2 of 4)]
[im 1/52]
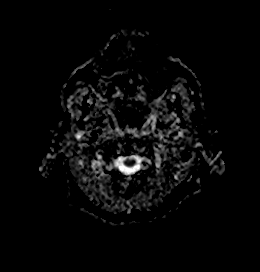
[im 18/52]
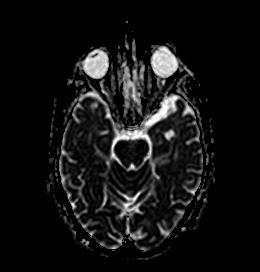
[im 35/52]
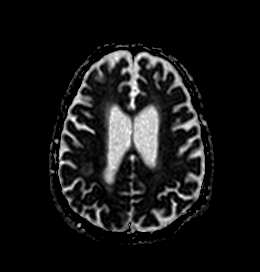
[im 52/52]
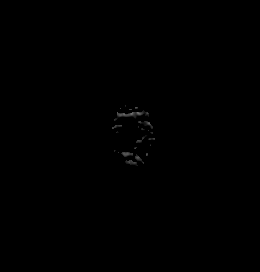

[Series 7: DWI · coronal · 4.0mm · 0.88mm/px · 6 of 74 slices shown (3 of 4)]
[im 1/74]
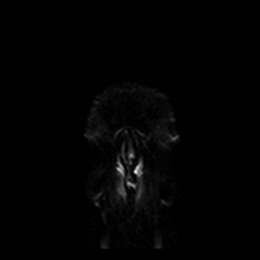
[im 15/74]
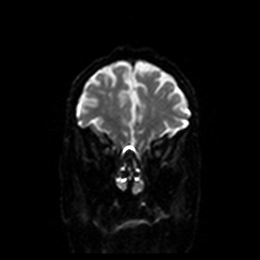
[im 30/74]
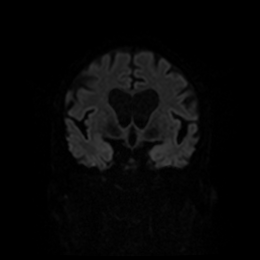
[im 44/74]
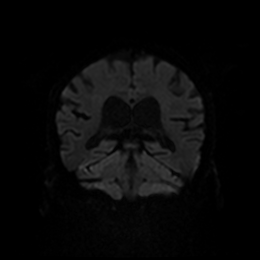
[im 59/74]
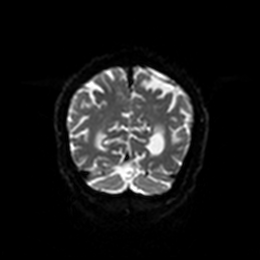
[im 74/74]
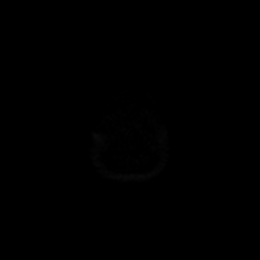

[Series 8: DWI · coronal · 4.0mm · 0.88mm/px · 3 of 37 slices shown (4 of 4)]
[im 1/37]
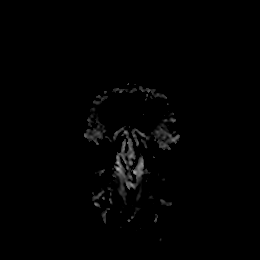
[im 19/37]
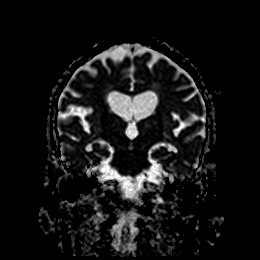
[im 37/37]
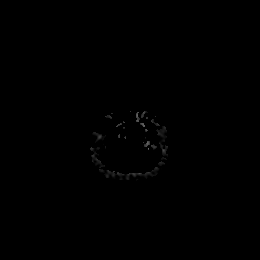

[Series 9: T1 · sagittal · 5.0mm · 0.75mm/px · 2 of 25 slices shown]
[im 1/25]
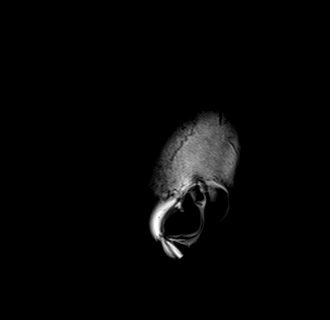
[im 25/25]
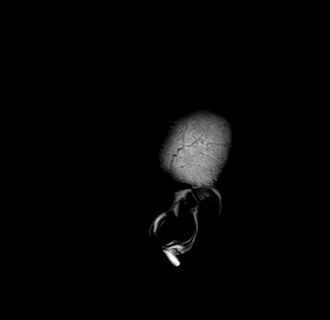

[Series 10: FLAIR · axial · 5.0mm · 0.45mm/px · z∈[-104,+36]mm · 2 of 25 slices shown]
[im 1/25]
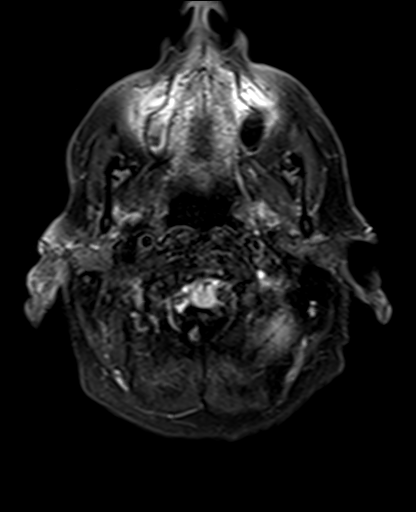
[im 25/25]
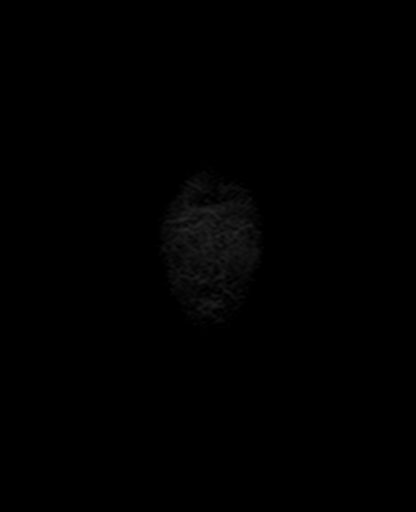

[Series 11: mag_images · axial · 3.0mm · 0.90mm/px · z∈[-108,+40]mm · 4 of 52 slices shown]
[im 1/52]
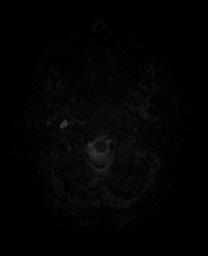
[im 18/52]
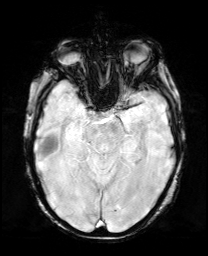
[im 35/52]
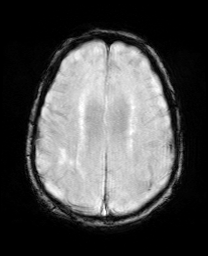
[im 52/52]
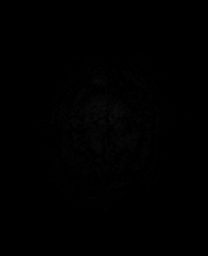

[Series 12: pha_images · axial · 3.0mm · 0.90mm/px · z∈[-108,+35]mm · 4 of 50 slices shown]
[im 1/50]
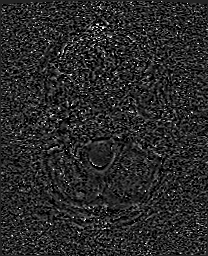
[im 17/50]
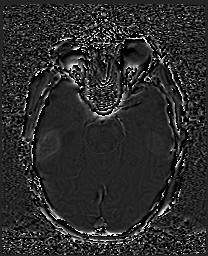
[im 33/50]
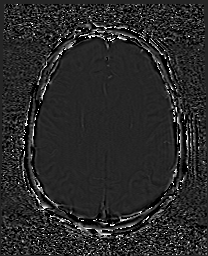
[im 50/50]
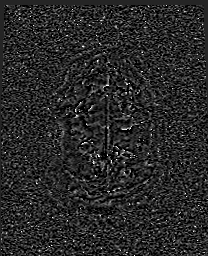

[Series 13: swi_images · axial · 3.0mm · 0.90mm/px · z∈[-108,+40]mm · 4 of 52 slices shown]
[im 1/52]
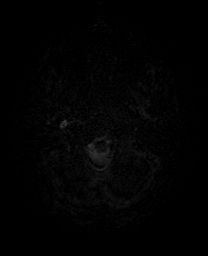
[im 18/52]
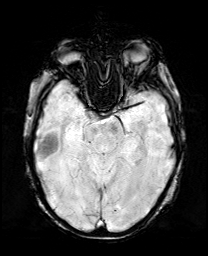
[im 35/52]
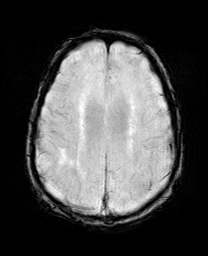
[im 52/52]
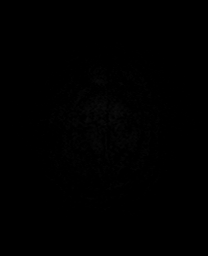

[Series 14: mip_images(sw) · axial · 24.0mm · 0.90mm/px · z∈[-98,+30]mm · 3 of 45 slices shown]
[im 1/45]
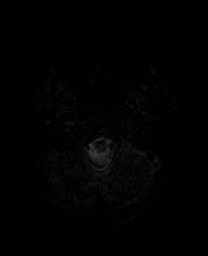
[im 23/45]
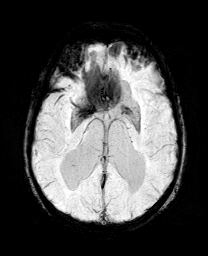
[im 45/45]
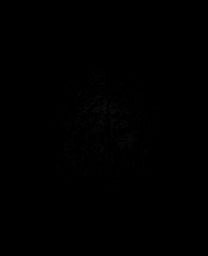

[Series 15: T2 · axial · 5.0mm · 0.72mm/px · z∈[-104,+36]mm · 2 of 25 slices shown (1 of 2)]
[im 1/25]
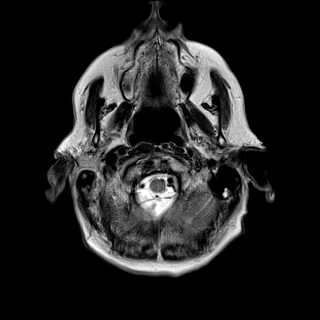
[im 25/25]
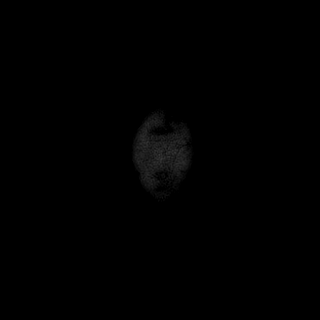

[Series 17: T2 · coronal · 5.0mm · 0.34mm/px · 2 of 30 slices shown (2 of 2)]
[im 1/30]
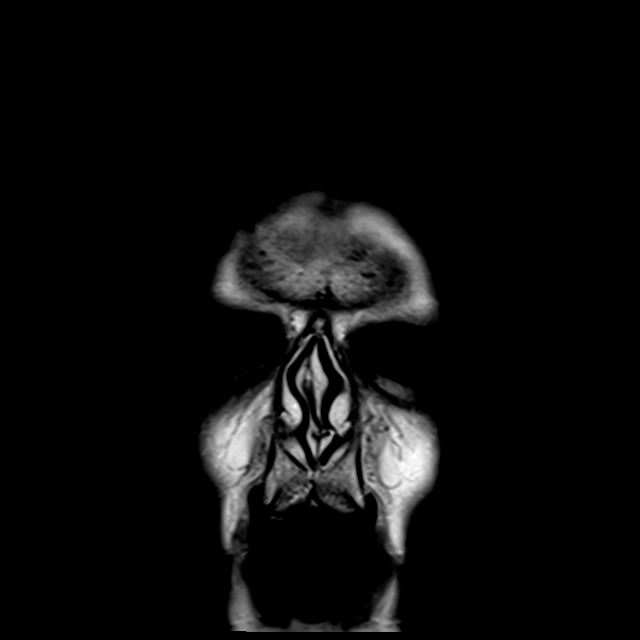
[im 30/30]
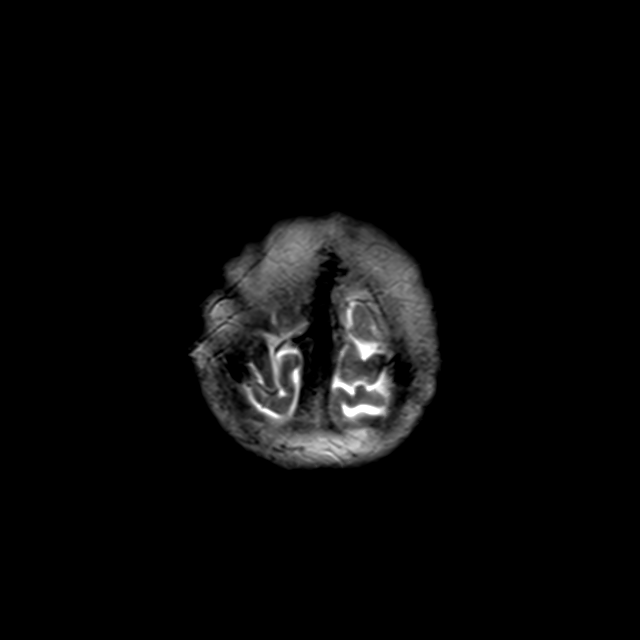

[44 of 48 positions shown; findings below may reference images not displayed]

FINDINGS: Brain: There is no acute infarction or intracranial hemorrhage.
There is no intracranial mass, mass effect, or edema. There is no
hydrocephalus or extra-axial fluid collection. Prominence of the
ventricles and sulci reflects generalized parenchymal volume loss.
Relative prominence of the ventricles is favored to be on an ex
vacuo basis. Patchy T2 hyperintensity in the supratentorial white
matter is nonspecific but may reflect mild to moderate chronic
microvascular ischemic changes. Very small chronic right cerebellar
infarct.

Vascular: Major vessel flow voids at the skull base are preserved.

Skull and upper cervical spine: Normal marrow signal is preserved.

Sinuses/Orbits: Paranasal sinuses are aerated. Left lens
replacement.

Other: Sella is unremarkable.  Mastoid air cells are clear.
IMPRESSION: No evidence of recent infarction, hemorrhage, or mass.

Chronic microvascular ischemic changes. Very small chronic right
cerebellar infarct.

## 2021-02-23 NOTE — ED Provider Notes (Signed)
Emergency Department Provider Note   I have reviewed the triage vital signs and the nursing notes.   HISTORY  Chief Complaint Fall and Weakness   HPI Brandon Gray is a 87 y.o. male with past medical history reviewed below including history of seizure presents to the emergency department with intermittent weakness and gait instability over the past week.  The patient arrives here with his son who provides additional history.  The patient currently is in independent living.  He typically is up and ambulatory without difficulty using a walker.  This changed over the past 1 to 2 weeks where he has had early fatigue and had instances where his gait seems more unstable than normal and will at times have difficulty getting out of a chair.  He is complained of some numbness in his left thigh as well but has some difficulty giving an exact timeframe on that.  He has had at least 2 falls that require people to come and help him off the floor.  This morning, the son states that he called him on the phone to check on him and his speech seemed very slurred which prompted him to come evaluate and ultimately bring him to the emergency department by private vehicle. Son notes that the speech at this time seems close to normal.   Patient is hard of hearing but denies any chest pain, trouble breathing, headaches, back or abdominal pain. No fever. No known history of CVA. Patient is compliant with his lamictal.   Past Medical History:  Diagnosis Date   Hearing loss    Hyperlipidemia    Seizures [facility])     Patient Active Problem List   Diagnosis Date Noted   Seizure disorder (HCC) 09/07/2020   Dyslipidemia 09/07/2020   Hearing loss 09/07/2020    History reviewed. No pertinent surgical history.  Allergies Patient has no known allergies.  Family History  Problem Relation Age of Onset   Diabetes Father    Cancer Daughter    Early death Daughter     Social History Social History    Tobacco Use   Smoking status: Never   Smokeless tobacco: Never  Substance Use Topics   Alcohol use: Yes    Alcohol/week: 7.0 standard drinks    Types: 7 Glasses of wine per week   Drug use: Never    Review of Systems  Constitutional: No fever/chills Eyes: No visual changes. ENT: No sore throat. Cardiovascular: Denies chest pain. Respiratory: Denies shortness of breath. Gastrointestinal: No abdominal pain.  No nausea, no vomiting.  No diarrhea.  No constipation. Genitourinary: Negative for dysuria. Musculoskeletal: Negative for back pain. Skin: Negative for rash. Neurological: Negative for headaches. Positive weakness and gait instability. Left thigh numbness. Slurred speech this AM.   10-point ROS otherwise negative.  ____________________________________________   PHYSICAL EXAM:  VITAL SIGNS: ED Triage Vitals  Enc Vitals Group     BP 02/23/21 1233 (!) 158/77     Pulse Rate 02/23/21 1233 72     Resp 02/23/21 1233 16     Temp 02/23/21 1233 98.2 F (36.8 C)     Temp Source 02/23/21 1233 Oral     SpO2 02/23/21 1233 96 %     Weight 02/23/21 1231 140 lb (63.5 kg)     Height 02/23/21 1231 5\' 4"  (1.626 m)    Constitutional: Alert and oriented. Well appearing and in no acute distress. Eyes: Conjunctivae are normal. PERRL. EOMI. Head: Atraumatic. Nose: No congestion/rhinnorhea. Mouth/Throat: Mucous membranes are  moist.  Neck: No stridor. No cervical spine tenderness to palpation. Cardiovascular: Normal rate, regular rhythm. Good peripheral circulation. Grossly normal heart sounds.   Respiratory: Normal respiratory effort.  No retractions. Lungs CTAB. Gastrointestinal: Soft and nontender. No distention.  Musculoskeletal: No lower extremity tenderness nor edema. No gross deformities of extremities. Neurologic:  Normal speech and language. No gross focal neurologic deficits are appreciated. 4+/5 strength bilaterally with upper and lower extremities. No facial asymmetry.   Skin:  Skin is warm, dry and intact. No rash noted.   ____________________________________________   LABS (all labs ordered are listed, but only abnormal results are displayed)  Labs Reviewed  BASIC METABOLIC PANEL - Abnormal; Notable for the following components:      Result Value   Glucose, Bld 102 (*)    All other components within normal limits  URINALYSIS, ROUTINE W REFLEX MICROSCOPIC - Abnormal; Notable for the following components:   Color, Urine COLORLESS (*)    All other components within normal limits  RESP PANEL BY RT-PCR (FLU A&B, COVID) ARPGX2  CBC  CBG MONITORING, ED   ____________________________________________  EKG   EKG Interpretation  Date/Time:  Tuesday February 23 2021 12:46:59 EDT Ventricular Rate:  62 PR Interval:  232 QRS Duration: 164 QT Interval:  427 QTC Calculation: 434 R Axis:   -31 Text Interpretation: Sinus rhythm Atrial premature complexes Prolonged PR interval Right bundle branch block No old tracing for comparison Confirmed by Alona Bene [phone]) on 02/23/2021 2:02:41 PM         ____________________________________________  RADIOLOGY  CT Head Wo Contrast  Result Date: 02/23/2021 CLINICAL DATA:  Altered mental status, unwitnessed fall EXAM: CT HEAD WITHOUT CONTRAST CT CERVICAL SPINE WITHOUT CONTRAST TECHNIQUE: Multidetector CT imaging of the head and cervical spine was performed following the standard protocol without intravenous contrast. Multiplanar CT image reconstructions of the cervical spine were also generated. COMPARISON:  None. FINDINGS: CT HEAD FINDINGS Brain: No evidence of acute infarction, hemorrhage, hydrocephalus, extra-axial collection or mass lesion/mass effect. Scattered low-density changes within the periventricular and subcortical white matter compatible with chronic microvascular ischemic change. Mild-moderate diffuse cerebral volume loss. Vascular: Atherosclerotic calcifications involving the large vessels of the skull  base. No unexpected hyperdense vessel. Skull: Normal. Negative for fracture or focal lesion. Sinuses/Orbits: No acute finding. Other: Negative for scalp hematoma. CT CERVICAL SPINE FINDINGS Alignment: Facet joints are aligned without dislocation or traumatic listhesis. Dens and lateral masses are aligned. Skull base and vertebrae: No acute fracture. No primary bone lesion or focal pathologic process. Soft tissues and spinal canal: No prevertebral fluid or swelling. No visible canal hematoma. Disc levels: Mild degenerative disc disease most pronounced at C5-6 and C6-7. Multilevel uncovertebral spurring. Bulky multilevel facet joint arthropathy, left worse than right. Upper chest: Included lung apices are clear. Other: Bilateral carotid atherosclerosis. IMPRESSION: 1. No acute intracranial findings. 2. Chronic microvascular ischemic change and cerebral volume loss. 3. No evidence of acute fracture or traumatic listhesis of the cervical spine. 4. Mild-moderate cervical spondylosis. Electronically Signed   By: Duanne Guess D.O.   On: 02/23/2021 14:06   CT Cervical Spine Wo Contrast  Result Date: 02/23/2021 CLINICAL DATA:  Altered mental status, unwitnessed fall EXAM: CT HEAD WITHOUT CONTRAST CT CERVICAL SPINE WITHOUT CONTRAST TECHNIQUE: Multidetector CT imaging of the head and cervical spine was performed following the standard protocol without intravenous contrast. Multiplanar CT image reconstructions of the cervical spine were also generated. COMPARISON:  None. FINDINGS: CT HEAD FINDINGS Brain: No evidence of acute infarction,  hemorrhage, hydrocephalus, extra-axial collection or mass lesion/mass effect. Scattered low-density changes within the periventricular and subcortical white matter compatible with chronic microvascular ischemic change. Mild-moderate diffuse cerebral volume loss. Vascular: Atherosclerotic calcifications involving the large vessels of the skull base. No unexpected hyperdense vessel. Skull:  Normal. Negative for fracture or focal lesion. Sinuses/Orbits: No acute finding. Other: Negative for scalp hematoma. CT CERVICAL SPINE FINDINGS Alignment: Facet joints are aligned without dislocation or traumatic listhesis. Dens and lateral masses are aligned. Skull base and vertebrae: No acute fracture. No primary bone lesion or focal pathologic process. Soft tissues and spinal canal: No prevertebral fluid or swelling. No visible canal hematoma. Disc levels: Mild degenerative disc disease most pronounced at C5-6 and C6-7. Multilevel uncovertebral spurring. Bulky multilevel facet joint arthropathy, left worse than right. Upper chest: Included lung apices are clear. Other: Bilateral carotid atherosclerosis. IMPRESSION: 1. No acute intracranial findings. 2. Chronic microvascular ischemic change and cerebral volume loss. 3. No evidence of acute fracture or traumatic listhesis of the cervical spine. 4. Mild-moderate cervical spondylosis. Electronically Signed   By: Duanne Guess D.O.   On: 02/23/2021 14:06    ____________________________________________   PROCEDURES  Procedure(s) performed:   Procedures  None  ____________________________________________   INITIAL IMPRESSION / ASSESSMENT AND PLAN / ED COURSE  Pertinent labs & imaging results that were available during my care of the patient were reviewed by me and considered in my medical decision making (see chart for details).   Patient presents to the emergency department with gait instability, falls, question of slurred speech.  Gait instability and falls is been occurring over the past 1 to 2 weeks with new slurred speech this morning which is since resolved.  TIA is a consideration.  Patient also having intermittent symptoms with history of seizure.  No witnessed seizure but patient does live alone in independent living.   02:36 PM  Patient's lab work is coming back with no acute abnormalities.  COVID and flu are negative.  No evidence of  urine infection.  No renal injury.  CBC is within normal limits.  CT imaging of the head and cervical spine obtained with no acute findings.  Vital signs are within normal limits.  I think the patient would benefit from MRI for further evaluation.  The history is not completely consistent with TIA.  Discussed options for management with the patient's son.  He feels comfortable transporting the patient to the [facility] emergency department for MRI.  If negative, the patient would be safe for discharge back to his independent living facility but would likely benefit from some physical therapy and outpatient neurology follow-up.  I will place those referrals in our system.   Dr. Denton Lank is the accepting EDP at [facility].   Spoke with the case Production designer, theatre/television/film.  Pending MRI results they could either arrange for home health at his independent living or if he requires a higher level of care his facility does accommodate increasing to assisted living.  They can help to coordinate either of these options.  Please call the case manager team after MRI is completed.  ____________________________________________  FINAL CLINICAL IMPRESSION(S) / ED DIAGNOSES  Final diagnoses:  Gait instability     Note:  This document was prepared using Dragon voice recognition software and may include unintentional dictation errors.  Alona Bene, MD, [facility] Emergency Medicine    Ariell Gunnels, Arlyss Repress, MD 02/23/21 [phone]

## 2021-02-23 NOTE — ED Triage Notes (Addendum)
Pt arrives to ED with c/o of fall. Pt reports that he has been weak, especially in his legs over the past week. Pt reports having an unwitnessed fall this morning after making breakfast and using dishwasher and could not get up. Pt reports that he was trying to grab his walker and had a mechanical fall. Pt denies dizziness, lightheadedness, CP, or SOB.

## 2021-02-23 NOTE — Discharge Instructions (Addendum)
You were seen in the emergency department today with leg weakness and falls.  Your lab work and CT imaging here is reassuring but I am sending you to the [facility] emergency department for an MRI.  If your MRI is normal, the case manager can discuss either home physical therapy or temporarily moving you to more of an assisted living type set up.  I have discussed this with them and after the MRI they can coordinate with you.  I have also placed a referral in our system to see the neurologist as an outpatient.   Please drive directly to the [facility] emergency department.  Please obey all traffic laws.  Please do not eat or drink prior to being seen at the [facility] ED.

## 2021-02-23 NOTE — ED Notes (Signed)
Called SW for placement

## 2021-02-23 NOTE — ED Provider Notes (Signed)
Care taken over from Dr. Jacqulyn Bath.  Patient has some generalized weakness and difficulty walking.  He was sent here for an MRI.  His MRI shows no acute abnormalities.  His labs are nonconcerning.  Home health services were initiated.  There was some discussion of trying to get him moved up to an assisted living but at this point the patient and his son are ready to go home.  He will go home with the son tonight and the son will try to work on getting him moved up to assisted living at [facility].  The social worker has talked with the patient and his son.  Neurology referral was initiated by Dr. Jacqulyn Bath.   Rolan Bucco, MD 02/23/21 2105

## 2021-02-23 NOTE — Care Management (Signed)
ED RNCM noted  consult patient was a transfer from [facility]  ED evaluation still  in process.  TOC team will continue to  be available for transitions of care planning.

## 2021-02-23 NOTE — Progress Notes (Signed)
.  Transition of Care [facility]) - Emergency Department Mini Assessment   Patient Details  Name: Brandon Gray MRN: [id number] Date of Birth: 09-23-1927  Transition of Care [facility]) CM/SW Contact:    Elliot Cousin, RN Phone Number: [phone] 02/23/2021, 2:54 PM   Clinical Narrative:  TOC CM received referral for possible HH at [place] at [facility]. [facility] does have an ALF and pt may need more assistance in the home. Waiting test results. ED provider will continue to follow up with [facility] CM/CSW for dc needs.   ED Mini Assessment: What brought you to the Emergency Department? : fall  Barriers to Discharge: Continued Medical Work up  [employer] interventions: waiting results of test     Interventions which prevented an admission or readmission: Home Health Consult or Services    Patient Contact and Communications   Admission diagnosis:  fall; weakness in legs  Patient Active Problem List   Diagnosis Date Noted   Seizure disorder (HCC) 09/07/2020   Dyslipidemia 09/07/2020   Hearing loss 09/07/2020   PCP:  Ardith Dark, MD Pharmacy:   [facility] [address] [place] - [address] [place] [address] Phone: [phone] Fax: [phone]

## 2021-02-23 NOTE — ED Notes (Signed)
Report given to Asher Muir, Charity fundraiser. Pt leaving with son POV transfer to [facility] ED.

## 2021-02-23 NOTE — ED Notes (Signed)
Pt being transported to CT

## 2021-02-25 ENCOUNTER — Ambulatory Visit: Payer: Medicare Other | Admitting: Family Medicine

## 2021-02-25 DIAGNOSIS — H9190 Unspecified hearing loss, unspecified ear: Secondary | ICD-10-CM | POA: Diagnosis not present

## 2021-02-25 DIAGNOSIS — G459 Transient cerebral ischemic attack, unspecified: Secondary | ICD-10-CM | POA: Diagnosis not present

## 2021-02-25 DIAGNOSIS — G40909 Epilepsy, unspecified, not intractable, without status epilepticus: Secondary | ICD-10-CM | POA: Diagnosis not present

## 2021-02-25 DIAGNOSIS — E785 Hyperlipidemia, unspecified: Secondary | ICD-10-CM | POA: Diagnosis not present

## 2021-03-01 ENCOUNTER — Ambulatory Visit: Payer: Medicare Other

## 2021-03-05 DIAGNOSIS — M170 Bilateral primary osteoarthritis of knee: Secondary | ICD-10-CM | POA: Diagnosis not present

## 2021-03-05 DIAGNOSIS — H9190 Unspecified hearing loss, unspecified ear: Secondary | ICD-10-CM | POA: Diagnosis not present

## 2021-03-05 DIAGNOSIS — S80921D Unspecified superficial injury of right lower leg, subsequent encounter: Secondary | ICD-10-CM | POA: Diagnosis not present

## 2021-03-05 DIAGNOSIS — M19041 Primary osteoarthritis, right hand: Secondary | ICD-10-CM | POA: Diagnosis not present

## 2021-03-05 DIAGNOSIS — Z9181 History of falling: Secondary | ICD-10-CM | POA: Diagnosis not present

## 2021-03-05 DIAGNOSIS — G40909 Epilepsy, unspecified, not intractable, without status epilepticus: Secondary | ICD-10-CM | POA: Diagnosis not present

## 2021-03-05 DIAGNOSIS — M19042 Primary osteoarthritis, left hand: Secondary | ICD-10-CM | POA: Diagnosis not present

## 2021-03-05 DIAGNOSIS — Z8673 Personal history of transient ischemic attack (TIA), and cerebral infarction without residual deficits: Secondary | ICD-10-CM | POA: Diagnosis not present

## 2021-03-05 DIAGNOSIS — E785 Hyperlipidemia, unspecified: Secondary | ICD-10-CM | POA: Diagnosis not present

## 2021-04-05 DIAGNOSIS — M19042 Primary osteoarthritis, left hand: Secondary | ICD-10-CM | POA: Diagnosis not present

## 2021-04-05 DIAGNOSIS — S80921D Unspecified superficial injury of right lower leg, subsequent encounter: Secondary | ICD-10-CM | POA: Diagnosis not present

## 2021-04-05 DIAGNOSIS — H9190 Unspecified hearing loss, unspecified ear: Secondary | ICD-10-CM | POA: Diagnosis not present

## 2021-04-05 DIAGNOSIS — M19041 Primary osteoarthritis, right hand: Secondary | ICD-10-CM | POA: Diagnosis not present

## 2021-04-05 DIAGNOSIS — M170 Bilateral primary osteoarthritis of knee: Secondary | ICD-10-CM | POA: Diagnosis not present

## 2021-04-05 DIAGNOSIS — Z8673 Personal history of transient ischemic attack (TIA), and cerebral infarction without residual deficits: Secondary | ICD-10-CM | POA: Diagnosis not present

## 2021-04-05 DIAGNOSIS — Z9181 History of falling: Secondary | ICD-10-CM | POA: Diagnosis not present

## 2021-04-05 DIAGNOSIS — G40909 Epilepsy, unspecified, not intractable, without status epilepticus: Secondary | ICD-10-CM | POA: Diagnosis not present

## 2021-04-05 DIAGNOSIS — E785 Hyperlipidemia, unspecified: Secondary | ICD-10-CM | POA: Diagnosis not present

## 2021-06-09 DIAGNOSIS — D1801 Hemangioma of skin and subcutaneous tissue: Secondary | ICD-10-CM | POA: Diagnosis not present

## 2021-06-09 DIAGNOSIS — L578 Other skin changes due to chronic exposure to nonionizing radiation: Secondary | ICD-10-CM | POA: Diagnosis not present

## 2021-06-11 DIAGNOSIS — L989 Disorder of the skin and subcutaneous tissue, unspecified: Secondary | ICD-10-CM | POA: Diagnosis not present

## 2021-06-11 DIAGNOSIS — E785 Hyperlipidemia, unspecified: Secondary | ICD-10-CM | POA: Diagnosis not present

## 2021-06-11 DIAGNOSIS — G40909 Epilepsy, unspecified, not intractable, without status epilepticus: Secondary | ICD-10-CM | POA: Diagnosis not present

## 2021-06-11 DIAGNOSIS — G459 Transient cerebral ischemic attack, unspecified: Secondary | ICD-10-CM | POA: Diagnosis not present

## 2021-06-22 DIAGNOSIS — D519 Vitamin B12 deficiency anemia, unspecified: Secondary | ICD-10-CM | POA: Diagnosis not present

## 2021-06-22 DIAGNOSIS — E785 Hyperlipidemia, unspecified: Secondary | ICD-10-CM | POA: Diagnosis not present

## 2021-06-22 DIAGNOSIS — E039 Hypothyroidism, unspecified: Secondary | ICD-10-CM | POA: Diagnosis not present

## 2021-06-22 DIAGNOSIS — D508 Other iron deficiency anemias: Secondary | ICD-10-CM | POA: Diagnosis not present

## 2021-06-22 DIAGNOSIS — E08311 Diabetes mellitus due to underlying condition with unspecified diabetic retinopathy with macular edema: Secondary | ICD-10-CM | POA: Diagnosis not present

## 2021-08-18 DIAGNOSIS — F5101 Primary insomnia: Secondary | ICD-10-CM | POA: Diagnosis not present

## 2021-08-29 ENCOUNTER — Other Ambulatory Visit: Payer: Self-pay

## 2021-08-29 ENCOUNTER — Emergency Department (HOSPITAL_BASED_OUTPATIENT_CLINIC_OR_DEPARTMENT_OTHER)
Admission: EM | Admit: 2021-08-29 | Discharge: 2021-08-29 | Disposition: A | Discharge status: Home / Self Care | Payer: Medicare Other | Attending: Emergency Medicine | Admitting: Emergency Medicine

## 2021-08-29 ENCOUNTER — Emergency Department (HOSPITAL_BASED_OUTPATIENT_CLINIC_OR_DEPARTMENT_OTHER): Payer: Medicare Other

## 2021-08-29 DIAGNOSIS — R9431 Abnormal electrocardiogram [ECG] [EKG]: Secondary | ICD-10-CM | POA: Diagnosis not present

## 2021-08-29 DIAGNOSIS — R531 Weakness: Secondary | ICD-10-CM | POA: Insufficient documentation

## 2021-08-29 DIAGNOSIS — R6883 Chills (without fever): Secondary | ICD-10-CM | POA: Diagnosis not present

## 2021-08-29 DIAGNOSIS — R059 Cough, unspecified: Secondary | ICD-10-CM

## 2021-08-29 DIAGNOSIS — U071 COVID-19: Secondary | ICD-10-CM | POA: Diagnosis not present

## 2021-08-29 LAB — BASIC METABOLIC PANEL
Anion gap: 9 (ref 5–15)
BUN: 22 mg/dL (ref 8–23)
CO2: 28 mmol/L (ref 22–32)
Calcium: 10 mg/dL (ref 8.9–10.3)
Chloride: 100 mmol/L (ref 98–111)
Creatinine, Ser: 1.07 mg/dL (ref 0.61–1.24)
GFR, Estimated: >60 mL/min (ref >60)
Glucose, Bld: 142 mg/dL — ABNORMAL HIGH (ref 70–99)
Potassium: 4.3 mmol/L (ref 3.5–5.1)
Sodium: 137 mmol/L (ref 135–145)

## 2021-08-29 LAB — RESP PANEL BY RT-PCR (FLU A&B, COVID) ARPGX2
Influenza A by PCR: NEGATIVE
Influenza B by PCR: NEGATIVE
SARS Coronavirus 2 by RT PCR: POSITIVE — AB

## 2021-08-29 LAB — URINALYSIS, ROUTINE W REFLEX MICROSCOPIC
Bilirubin Urine: NEGATIVE
Glucose, UA: NEGATIVE mg/dL
Leukocytes,Ua: NEGATIVE
Nitrite: NEGATIVE
Specific Gravity, Urine: 1.026 (ref 1.005–1.030)
pH: 6 (ref 5.0–8.0)

## 2021-08-29 LAB — CBC
HCT: 39.9 % (ref 39.0–52.0)
Hemoglobin: 12.9 g/dL — ABNORMAL LOW (ref 13.0–17.0)
MCH: 29.3 pg (ref 26.0–34.0)
MCHC: 32.3 g/dL (ref 30.0–36.0)
MCV: 90.5 fL (ref 80.0–100.0)
Platelets: 240 10*3/uL (ref 150–400)
RBC: 4.41 MIL/uL (ref 4.22–5.81)
RDW: 13.8 % (ref 11.5–15.5)
WBC: 11.2 10*3/uL — ABNORMAL HIGH (ref 4.0–10.5)
nRBC: 0 % (ref 0.0–0.2)

## 2021-08-29 LAB — HEPATIC FUNCTION PANEL
ALT: 13 U/L (ref 0–44)
AST: 17 U/L (ref 15–41)
Albumin: 4.6 g/dL (ref 3.5–5.0)
Alkaline Phosphatase: 76 U/L (ref 38–126)
Bilirubin, Direct: 0.1 mg/dL (ref 0.0–0.2)
Indirect Bilirubin: 0.4 mg/dL (ref 0.3–0.9)
Total Bilirubin: 0.5 mg/dL (ref 0.3–1.2)
Total Protein: 7 g/dL (ref 6.5–8.1)

## 2021-08-29 LAB — CBG MONITORING, ED: Glucose-Capillary: 111 mg/dL — ABNORMAL HIGH (ref 70–99)

## 2021-08-29 LAB — TROPONIN I (HIGH SENSITIVITY): Troponin I (High Sensitivity): 10 ng/L (ref <18)

## 2021-08-29 MED ORDER — NIRMATRELVIR/RITONAVIR (PAXLOVID)TABLET: 3.0000 | ORAL_TABLET | Freq: Two times a day (BID) | ORAL | 0 refills | Status: AC

## 2021-08-29 MED ORDER — SODIUM CHLORIDE 0.9 % IV BOLUS
500.0000 mL | Freq: Once | INTRAVENOUS | Status: AC
  Administered 2021-08-29: 500 mL via INTRAVENOUS

## 2021-08-29 NOTE — ED Notes (Signed)
Patient aware that urine sample is needed.

## 2021-08-29 NOTE — ED Notes (Signed)
EMT-P provided AVS using Teachback Method. Patient verbalizes understanding of Discharge Instructions. Opportunity for Questioning and Answers were provided by EMT-P. Patient Discharged from ED.  ? ?

## 2021-08-29 NOTE — Discharge Instructions (Addendum)
Brandon Gray was diagnosed with COVID today.  We discussed starting him on an antiviral pill called Palxovid.  Taking his medication as soon as possible will help prevent the severity of the COVID illness.  He will need to HOLD his simvastatin medication for the next 5 days until he finishes his Paxlovid.  Then he can resume his simvastatin.  Brandon Gray should quarantine for approximately 7 days from the onset of his symptoms.  He should be encouraged to wear a mask until 10 days after the onset of symptoms.  Please encourage him to keep eating and drinking to stay hydrated and keep his energy up.  If he begins having difficulty breathing, frequent falls and lightheadedness, please come back to the ER

## 2021-08-29 NOTE — ED Triage Notes (Signed)
Patient reports to the ER for cough for a few days that has made him feel nauseated and weakness. Patient needed assistance to get to the chair.

## 2021-08-29 NOTE — ED Provider Notes (Signed)
[facility] Provider Note   CSN: [id number] Arrival date & time: 08/29/21  1455     History  Chief Complaint  Patient presents with   Cough   Weakness    Brandon Gray is a 88 y.o. male presenting to the ED with weakness, cough.  [facility] is [facility], with his son at bedside.  They report patient has had cough for 2 days, feels nauseous, low energy.  Typically walks with walker but more unsteady recently.  No diarrhea.  No chest pain or pressure. No headache, sore throat, abdominal pain.  He takes only lamictal (for long-ago seizure maintenance), melatonin for sleep, and a statin for cholesterol.  No new meds.  HPI     Home Medications Prior to Admission medications   Medication Sig Start Date End Date Taking? Authorizing Provider  nirmatrelvir/ritonavir EUA (PAXLOVID) 20 x 150 MG & 10 x 100MG  TABS Take 3 tablets by mouth 2 (two) times daily for 5 days. Patient GFR is >60 (normal). Take nirmatrelvir (150 mg) two tablets twice daily for 5 days and ritonavir (100 mg) one tablet twice daily for 5 days. 08/29/21 09/03/21 Yes Analeese Andreatta, Kermit BaloMatthew J, MD  lamoTRIgine (LAMICTAL) 200 MG tablet Take 1 tablet (200 mg total) by mouth 2 (two) times daily. 09/07/20   Ardith DarkParker, Caleb M, MD  polyethylene glycol (MIRALAX / GLYCOLAX) 17 g packet Take 17 g by mouth daily.    [provider]  simvastatin (ZOCOR) 20 MG tablet Take 1 tablet (20 mg total) by mouth daily. 09/07/20   Ardith DarkParker, Caleb M, MD      Allergies    Patient has no known allergies.    Review of Systems   Review of Systems  Physical Exam Updated Vital Signs BP 135/63    Pulse 83    Temp 97.9 F (36.6 C) (Tympanic)    Resp 17    Ht 5\' 4"  (1.626 m)    Wt 64.4 kg    SpO2 96%    BMI 24.37 kg/m  Physical Exam Constitutional:      General: He is not in acute distress. HENT:     Head: Normocephalic and atraumatic.  Eyes:     Conjunctiva/sclera: Conjunctivae normal.     Pupils:  Pupils are equal, round, and reactive to light.  Cardiovascular:     Rate and Rhythm: Normal rate and regular rhythm.  Pulmonary:     Effort: Pulmonary effort is normal. No respiratory distress.  Abdominal:     General: There is no distension.     Tenderness: There is no abdominal tenderness.  Skin:    General: Skin is warm and dry.  Neurological:     General: No focal deficit present.     Mental Status: He is alert. Mental status is at baseline.  Psychiatric:        Mood and Affect: Mood normal.        Behavior: Behavior normal.    ED Results / Procedures / Treatments   Labs (all labs ordered are listed, but only abnormal results are displayed) Labs Reviewed  RESP PANEL BY RT-PCR (FLU A&B, COVID) ARPGX2 - Abnormal; Notable for the following components:      Result Value   SARS Coronavirus 2 by RT PCR POSITIVE (*)    All other components within normal limits  BASIC METABOLIC PANEL - Abnormal; Notable for the following components:   Glucose, Bld 142 (*)    All other components within normal  limits  CBC - Abnormal; Notable for the following components:   WBC 11.2 (*)    Hemoglobin 12.9 (*)    All other components within normal limits  URINALYSIS, ROUTINE W REFLEX MICROSCOPIC - Abnormal; Notable for the following components:   Hgb urine dipstick TRACE (*)    Ketones, ur TRACE (*)    Protein, ur TRACE (*)    All other components within normal limits  CBG MONITORING, ED - Abnormal; Notable for the following components:   Glucose-Capillary 111 (*)    All other components within normal limits  HEPATIC FUNCTION PANEL  TROPONIN I (HIGH SENSITIVITY)    EKG EKG Interpretation  Date/Time:  Sunday August 29 2021 15:39:31 EST Ventricular Rate:  76 PR Interval:  214 QRS Duration: 153 QT Interval:  413 QTC Calculation: 465 R Axis:   -30 Text Interpretation: Sinus rhythm Borderline prolonged PR interval Right bundle branch block Confirmed by Alvester Chou [phone]) on 08/29/2021  4:15:29 PM  Radiology DG Chest Port 1 View  Result Date: 08/29/2021 CLINICAL DATA:  Cough, chills EXAM: PORTABLE CHEST 1 VIEW COMPARISON:  None. FINDINGS: Cardiac size is within normal limits. There is poor inspiration. There are no signs of alveolar pulmonary edema. Increased interstitial markings are seen in the lower lung fields. There is no pleural effusion or pneumothorax. There is skin soft tissue interface in the lateral aspect of right upper lung fields, possibly a skin fold. IMPRESSION: Increased interstitial markings in the lower lung fields may be due to poor inspiration or suggest early interstitial pneumonia. There is no focal pulmonary consolidation. There are no signs of alveolar pulmonary edema. Electronically Signed   By: Ernie Avena M.D.   On: 08/29/2021 16:13    Procedures Procedures    Medications Ordered in ED Medications  sodium chloride 0.9 % bolus 500 mL (0 mLs Intravenous Stopped 08/29/21 1740)    ED Course/ Medical Decision Making/ A&P Clinical Course as of 08/30/21 0944  Sun Aug 29, 2021  1614 SARS Coronavirus 2 by RT PCR(!): POSITIVE [MT]  1658 The patient and his son were updated regarding the COVID diagnosis.  He is now on day 2 of symptoms, he would be eligible for antiviral therapy and they are interested in this. Paxlovid prescribed; advised patient and son he needs to HOLD statin for 5 days while on this.  I prescribed this to the pharmacy.  Kidney function is normal.  Otherwise work-up is unremarkable, no evidence of sepsis.  UA without overt sign of infection.  He is at assisted living, I explained to the son there is some quarantine measures and need to be in place but they should have a policy for this. [MT]  1710 Per pharmaceutical review, no adjustment required for Lamictal [MT]    Clinical Course User Index [MT] Meia Emley, Kermit Balo, MD                           Medical Decision Making  This patient presents to the ED with concern for  weakness, cough. This involves an extensive number of treatment options, and is a complaint that carries with it a high risk of complications and morbidity.  The differential diagnosis includes infection viral illness vs PNA vs anemia vs dehydration vs atypical ACS vs other  No clinical history consistent with recurring seizures  Co-morbidities that complicate the patient evaluation: age  Additional history obtained from patient's son at bedside  External records from outside source  obtained and reviewed including MR brain July 2022 with no acute infarcts noted  I ordered and personally interpreted labs.  The pertinent results include:  Covid (+), trop unremarkable  I ordered imaging studies including dg chest, I independently visualized and interpreted imaging which showed pattern of possible interstitial PNA consistent with covid viral illness I agree with the radiologist interpretation  Per my interpretation the patient's ECG shows sinus rhythm  I ordered medication including IV fluid bolus for hydration (UA with +ketones, protein) I have reviewed the patients home medicines and have made adjustments as needed  Test Considered:  - Doubt PE, CVA - did not feel CTH or CT PE warranted at this time   After the interventions noted above, I reevaluated the patient and found that they have: improved  Social Determinants of Health:age, assisted living facility  Dispostion:  After consideration of the diagnostic results and the patients response to treatment, I feel that the patent would benefit from Paxlovid antiviral therapy.  Patient remained stable in ED, no hypoxia or respiratory distress.  Given that he had a viral diagnosis, and no fever or leukocytosis, I did not feel he required antibiotics.  Brandon Gray was evaluated in Emergency Department on 08/30/2021 for the symptoms described in the history of present illness. He was evaluated in the context of the global COVID-19  pandemic, which necessitated consideration that the patient might be at risk for infection with the SARS-CoV-2 virus that causes COVID-19. Institutional protocols and algorithms that pertain to the evaluation of patients at risk for COVID-19 are in a state of rapid change based on information released by regulatory bodies including the CDC and federal and state organizations. These policies and algorithms were followed during the patient's care in the ED.   Clinical Course as of 08/30/21 0944  Sun Aug 29, 2021  1614 SARS Coronavirus 2 by RT PCR(!): POSITIVE [MT]  1658 The patient and his son were updated regarding the COVID diagnosis.  He is now on day 2 of symptoms, he would be eligible for antiviral therapy and they are interested in this. Paxlovid prescribed; advised patient and son he needs to HOLD statin for 5 days while on this.  I prescribed this to the pharmacy.  Kidney function is normal.  Otherwise work-up is unremarkable, no evidence of sepsis.  UA without overt sign of infection.  He is at assisted living, I explained to the son there is some quarantine measures and need to be in place but they should have a policy for this. [MT]  1710 Per pharmaceutical review, no adjustment required for Lamictal [MT]    Clinical Course User Index [MT] Josefita Weissmann, Kermit Balo, MD            Final Clinical Impression(s) / ED Diagnoses Final diagnoses:  Cough  COVID-19    Rx / DC Orders ED Discharge Orders          Ordered    nirmatrelvir/ritonavir EUA (PAXLOVID) 20 x 150 MG & 10 x 100MG  TABS  2 times daily        08/29/21 1711              08/31/21, MD 08/30/21 [phone]

## 2021-09-03 DIAGNOSIS — E785 Hyperlipidemia, unspecified: Secondary | ICD-10-CM | POA: Diagnosis not present

## 2021-09-03 DIAGNOSIS — G40909 Epilepsy, unspecified, not intractable, without status epilepticus: Secondary | ICD-10-CM | POA: Diagnosis not present

## 2021-09-03 DIAGNOSIS — N183 Chronic kidney disease, stage 3 unspecified: Secondary | ICD-10-CM | POA: Diagnosis not present

## 2021-09-03 DIAGNOSIS — U071 COVID-19: Secondary | ICD-10-CM | POA: Diagnosis not present

## 2021-11-06 ENCOUNTER — Other Ambulatory Visit: Payer: Self-pay

## 2021-11-06 ENCOUNTER — Observation Stay (HOSPITAL_BASED_OUTPATIENT_CLINIC_OR_DEPARTMENT_OTHER)
Admission: EM | Admit: 2021-11-06 | Discharge: 2021-11-09 | Disposition: A | Discharge status: Home Health | Payer: Medicare Other | Attending: Family Medicine | Admitting: Family Medicine

## 2021-11-06 ENCOUNTER — Emergency Department (HOSPITAL_BASED_OUTPATIENT_CLINIC_OR_DEPARTMENT_OTHER): Payer: Medicare Other

## 2021-11-06 ENCOUNTER — Encounter (HOSPITAL_BASED_OUTPATIENT_CLINIC_OR_DEPARTMENT_OTHER): Payer: Self-pay | Admitting: Emergency Medicine

## 2021-11-06 DIAGNOSIS — R41841 Cognitive communication deficit: Secondary | ICD-10-CM | POA: Diagnosis not present

## 2021-11-06 DIAGNOSIS — H903 Sensorineural hearing loss, bilateral: Secondary | ICD-10-CM | POA: Insufficient documentation

## 2021-11-06 DIAGNOSIS — R2681 Unsteadiness on feet: Secondary | ICD-10-CM | POA: Insufficient documentation

## 2021-11-06 DIAGNOSIS — I6529 Occlusion and stenosis of unspecified carotid artery: Secondary | ICD-10-CM

## 2021-11-06 DIAGNOSIS — Z7982 Long term (current) use of aspirin: Secondary | ICD-10-CM | POA: Diagnosis not present

## 2021-11-06 DIAGNOSIS — Z8673 Personal history of transient ischemic attack (TIA), and cerebral infarction without residual deficits: Secondary | ICD-10-CM | POA: Insufficient documentation

## 2021-11-06 DIAGNOSIS — M6281 Muscle weakness (generalized): Secondary | ICD-10-CM | POA: Diagnosis not present

## 2021-11-06 DIAGNOSIS — R4182 Altered mental status, unspecified: Secondary | ICD-10-CM | POA: Diagnosis not present

## 2021-11-06 DIAGNOSIS — E7800 Pure hypercholesterolemia, unspecified: Secondary | ICD-10-CM | POA: Insufficient documentation

## 2021-11-06 DIAGNOSIS — I639 Cerebral infarction, unspecified: Principal | ICD-10-CM

## 2021-11-06 DIAGNOSIS — R404 Transient alteration of awareness: Secondary | ICD-10-CM

## 2021-11-06 DIAGNOSIS — R2990 Unspecified symptoms and signs involving the nervous system: Secondary | ICD-10-CM

## 2021-11-06 DIAGNOSIS — R931 Abnormal findings on diagnostic imaging of heart and coronary circulation: Secondary | ICD-10-CM

## 2021-11-06 DIAGNOSIS — E785 Hyperlipidemia, unspecified: Secondary | ICD-10-CM | POA: Diagnosis not present

## 2021-11-06 DIAGNOSIS — R4189 Other symptoms and signs involving cognitive functions and awareness: Secondary | ICD-10-CM | POA: Diagnosis not present

## 2021-11-06 DIAGNOSIS — R569 Unspecified convulsions: Secondary | ICD-10-CM | POA: Diagnosis not present

## 2021-11-06 DIAGNOSIS — R55 Syncope and collapse: Secondary | ICD-10-CM | POA: Diagnosis present

## 2021-11-06 DIAGNOSIS — Z8669 Personal history of other diseases of the nervous system and sense organs: Secondary | ICD-10-CM

## 2021-11-06 LAB — RAPID URINE DRUG SCREEN, HOSP PERFORMED
Amphetamines: NOT DETECTED
Barbiturates: NOT DETECTED
Benzodiazepines: NOT DETECTED
Cocaine: NOT DETECTED
Opiates: NOT DETECTED
Tetrahydrocannabinol: NOT DETECTED

## 2021-11-06 LAB — CBC WITH DIFFERENTIAL/PLATELET
Abs Immature Granulocytes: 0.01 10*3/uL (ref 0.00–0.07)
Basophils Absolute: 0 10*3/uL (ref 0.0–0.1)
Basophils Relative: 0 %
Eosinophils Absolute: 0.1 10*3/uL (ref 0.0–0.5)
Eosinophils Relative: 1 %
HCT: 39.6 % (ref 39.0–52.0)
Hemoglobin: 12.9 g/dL — ABNORMAL LOW (ref 13.0–17.0)
Immature Granulocytes: 0 %
Lymphocytes Relative: 31 %
Lymphs Abs: 2.2 10*3/uL (ref 0.7–4.0)
MCH: 29.7 pg (ref 26.0–34.0)
MCHC: 32.6 g/dL (ref 30.0–36.0)
MCV: 91 fL (ref 80.0–100.0)
Monocytes Absolute: 0.6 10*3/uL (ref 0.1–1.0)
Monocytes Relative: 9 %
Neutro Abs: 4.2 10*3/uL (ref 1.7–7.7)
Neutrophils Relative %: 59 %
Platelets: 244 10*3/uL (ref 150–400)
RBC: 4.35 MIL/uL (ref 4.22–5.81)
RDW: 14.1 % (ref 11.5–15.5)
WBC: 7.1 10*3/uL (ref 4.0–10.5)
nRBC: 0 % (ref 0.0–0.2)

## 2021-11-06 LAB — BASIC METABOLIC PANEL
Anion gap: 10 (ref 5–15)
BUN: 31 mg/dL — ABNORMAL HIGH (ref 8–23)
CO2: 27 mmol/L (ref 22–32)
Calcium: 10.1 mg/dL (ref 8.9–10.3)
Chloride: 101 mmol/L (ref 98–111)
Creatinine, Ser: 1.19 mg/dL (ref 0.61–1.24)
GFR, Estimated: 58 mL/min — ABNORMAL LOW (ref >60)
Glucose, Bld: 92 mg/dL (ref 70–99)
Potassium: 4.2 mmol/L (ref 3.5–5.1)
Sodium: 138 mmol/L (ref 135–145)

## 2021-11-06 LAB — TROPONIN I (HIGH SENSITIVITY)
Troponin I (High Sensitivity): 10 ng/L (ref <18)
Troponin I (High Sensitivity): 11 ng/L (ref <18)

## 2021-11-06 LAB — URINALYSIS, COMPLETE (UACMP) WITH MICROSCOPIC
Bacteria, UA: NONE SEEN
Bilirubin Urine: NEGATIVE
Glucose, UA: NEGATIVE mg/dL
Hgb urine dipstick: NEGATIVE
Ketones, ur: NEGATIVE mg/dL
Leukocytes,Ua: NEGATIVE
Nitrite: NEGATIVE
Protein, ur: NEGATIVE mg/dL
Specific Gravity, Urine: 1.009 (ref 1.005–1.030)
pH: 7 (ref 5.0–8.0)

## 2021-11-06 LAB — CBG MONITORING, ED: Glucose-Capillary: 90 mg/dL (ref 70–99)

## 2021-11-06 IMAGING — CT CT HEAD W/O CM
4 series · 17 of 47 positions shown, 19 images · non-contrast
Comparison: [DATE]

CLINICAL DATA: Altered mental status possible syncope



[Series 2: head wo · axial · 0.45mm/px · z∈[-253,-143]mm · 7 of 30 slices shown, 9 images]
[im 4/30  brain]
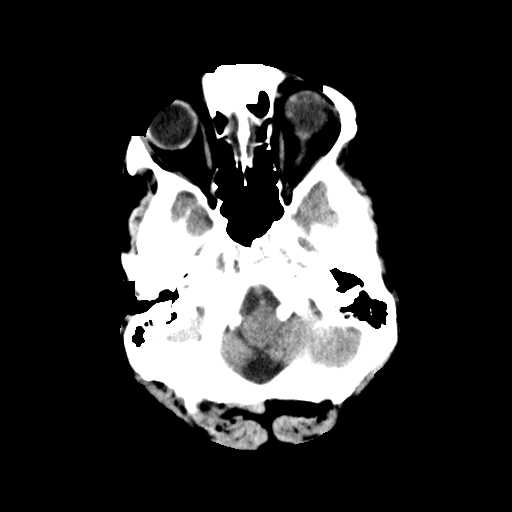
[im 4/30  bone]
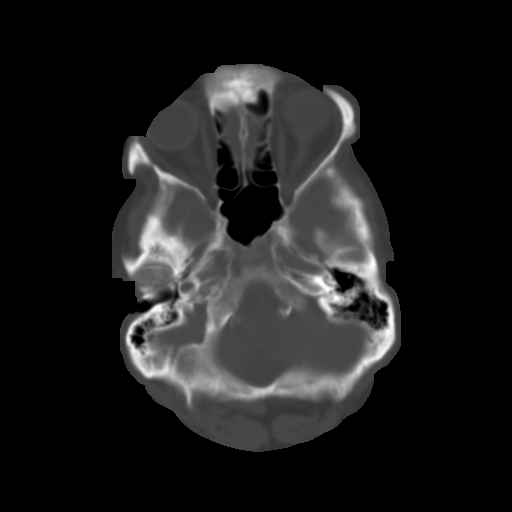
[im 8/30  brain]
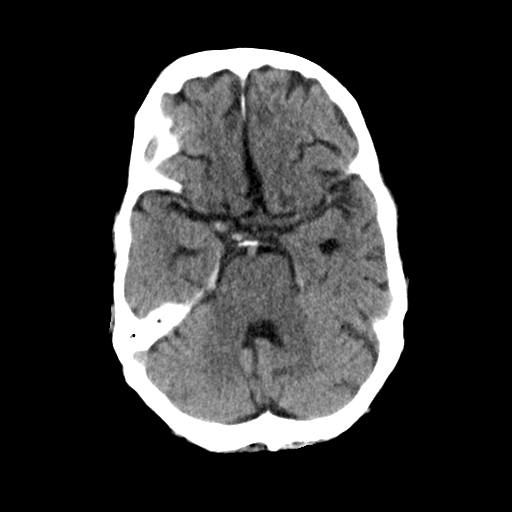
[im 11/30  brain]
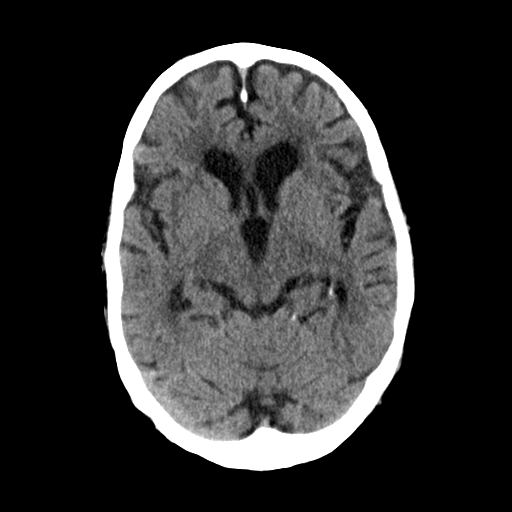
[im 15/30  brain]
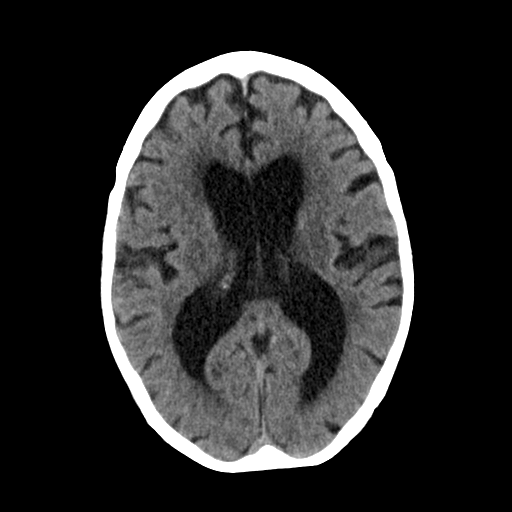
[im 19/30  brain]
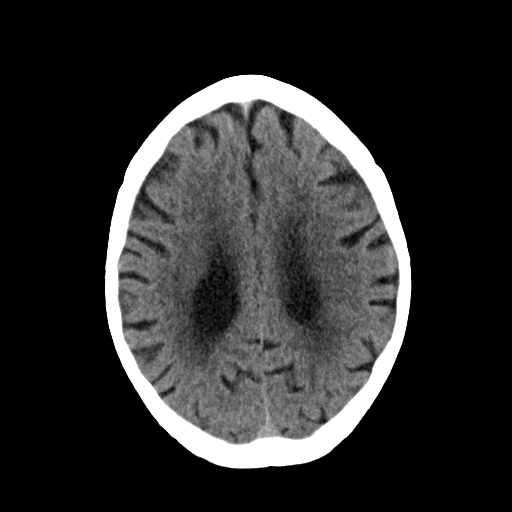
[im 19/30  bone]
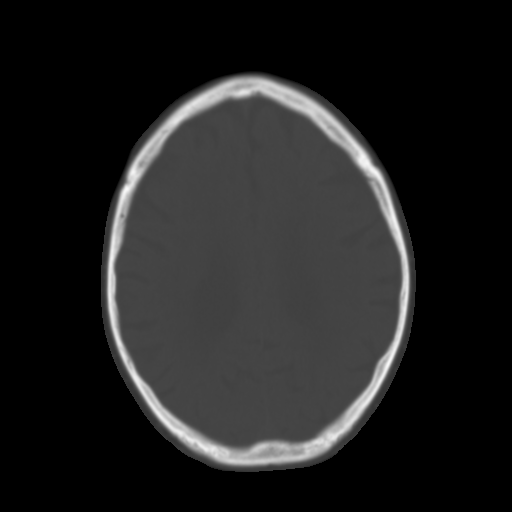
[im 22/30  brain]
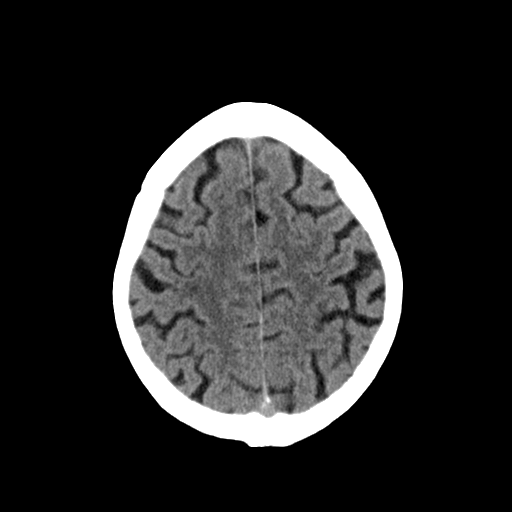
[im 26/30  brain]
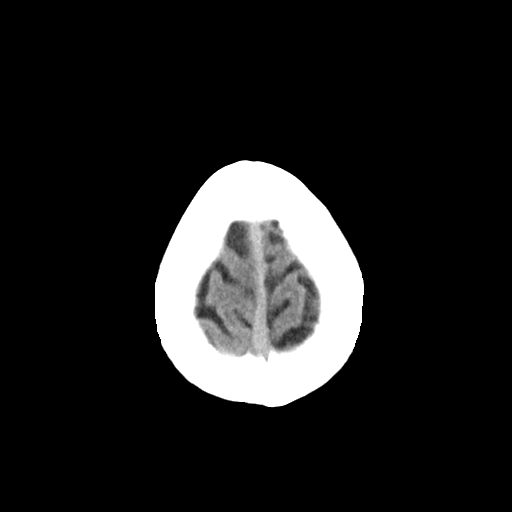

[Series 3: head bone · axial · 0.45mm/px · z∈[-254,-202]mm · 4 of 75 slices shown]
[im 8/75  bone]
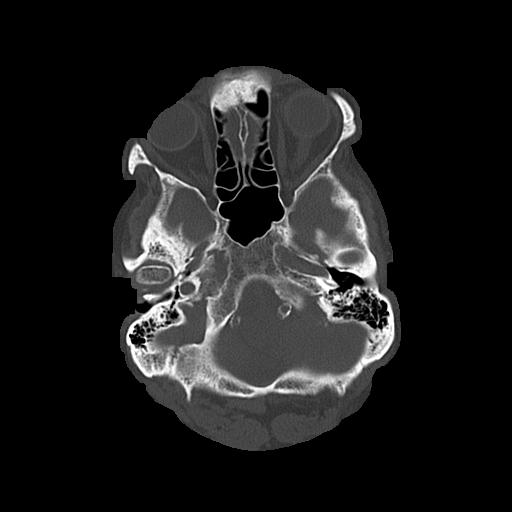
[im 15/75  bone]
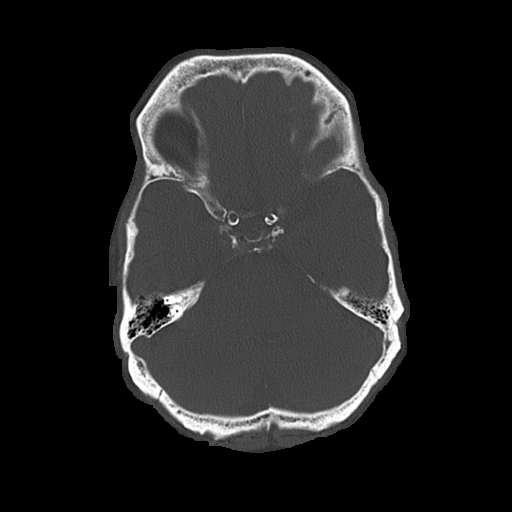
[im 23/75  bone]
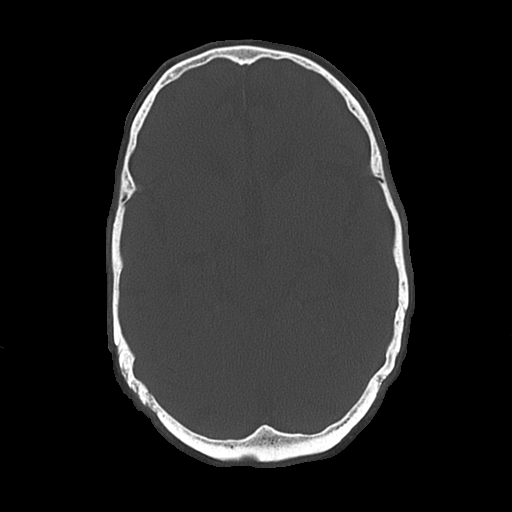
[im 34/75  bone]
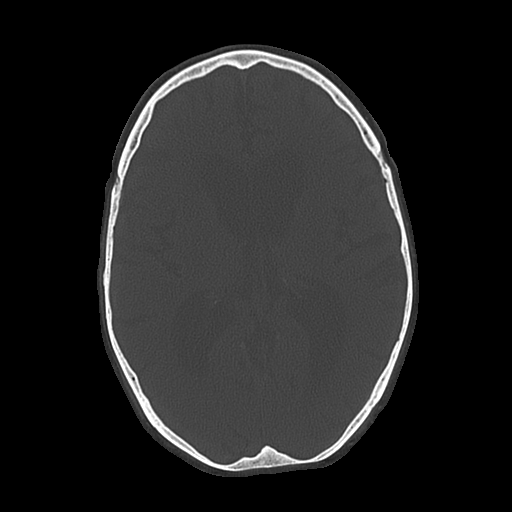

[Series 4: coronal soft · coronal · 0.33mm/px · 3 of 69 slices shown]
[im 23/69  brain]
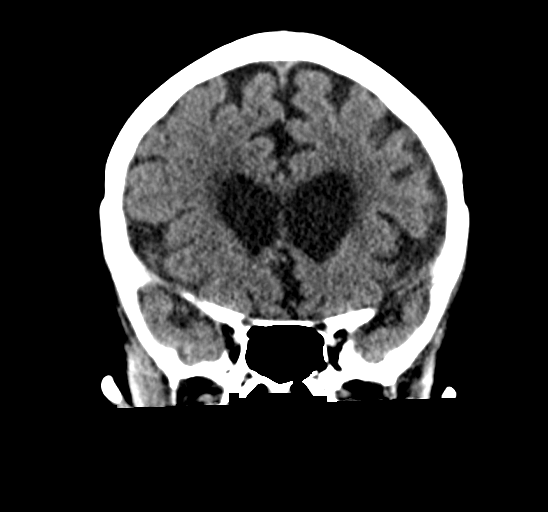
[im 31/69  brain]
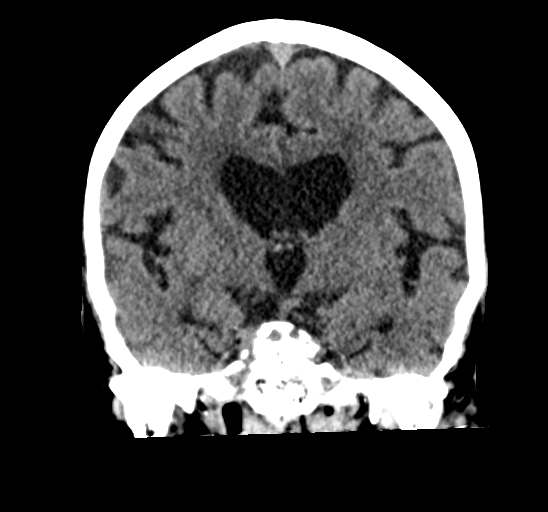
[im 38/69  brain]
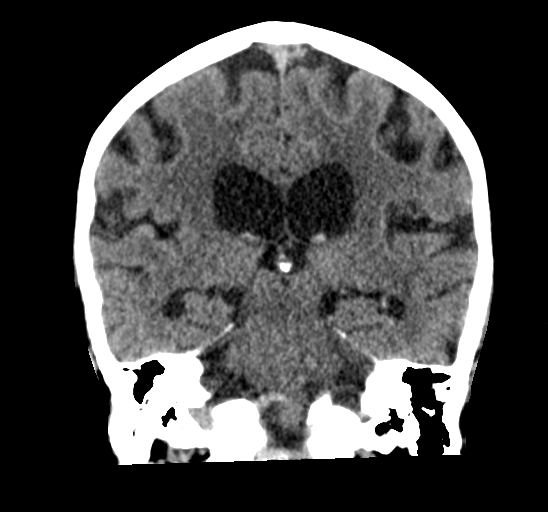

[Series 5: sagittal soft · sagittal · 0.33mm/px · 3 of 55 slices shown]
[im 19/55  brain]
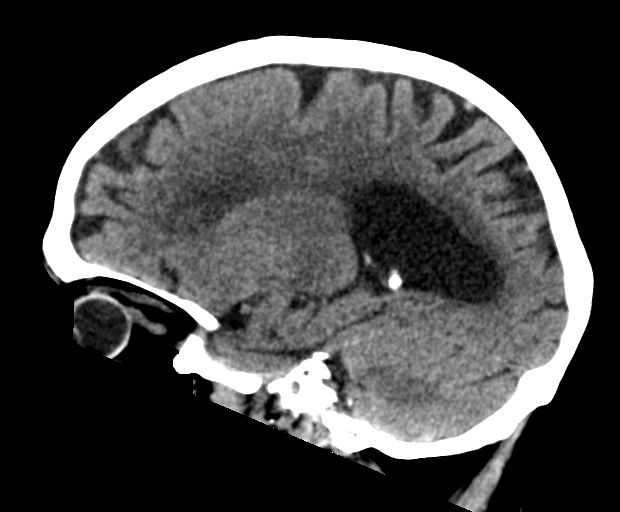
[im 28/55  brain]
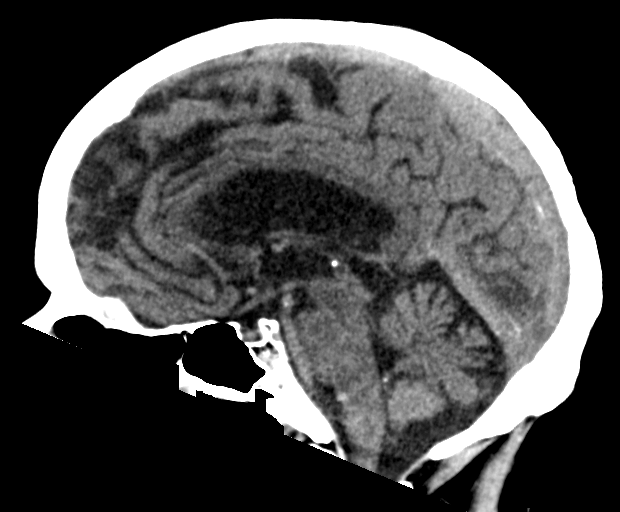
[im 37/55  brain]
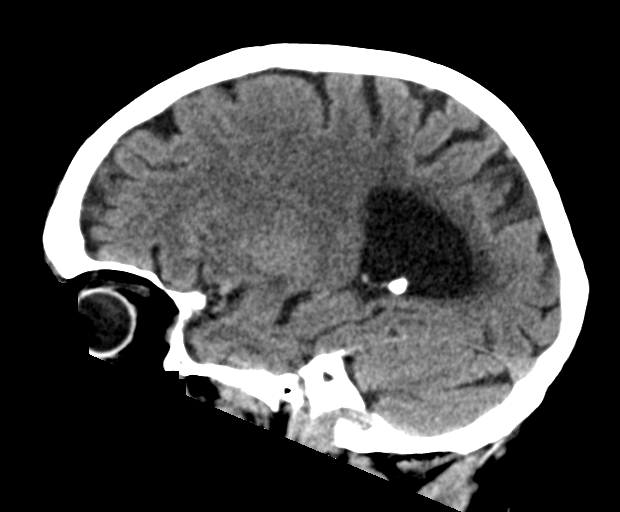

[17 of 47 positions shown; findings below may reference images not displayed]

FINDINGS: Brain: No evidence of acute infarction, hemorrhage, hydrocephalus,
extra-axial collection or mass lesion/mass effect. Periventricular
and deep white matter hypodensity.

Vascular: No hyperdense vessel or unexpected calcification.

Skull: Normal. Negative for fracture or focal lesion.

Sinuses/Orbits: No acute finding.

Other: None.
IMPRESSION: No acute intracranial pathology. Small-vessel white matter disease.

## 2021-11-06 MED ORDER — ACETAMINOPHEN 650 MG RE SUPP: 650.0000 mg | Freq: Four times a day (QID) | RECTAL | Status: DC | PRN

## 2021-11-06 MED ORDER — MELATONIN 3 MG PO TABS
3.0000 mg | ORAL_TABLET | Freq: Every evening | ORAL | Status: DC | PRN
  Administered 2021-11-06 – 2021-11-08 (×3): 3 mg via ORAL
  Filled 2021-11-06 (×3): qty 1

## 2021-11-06 MED ORDER — LORAZEPAM 2 MG/ML IJ SOLN: 1.0000 mg | INTRAMUSCULAR | Status: DC | PRN

## 2021-11-06 MED ORDER — SIMVASTATIN 20 MG PO TABS
20.0000 mg | ORAL_TABLET | Freq: Every day | ORAL | Status: DC
  Administered 2021-11-07: 20 mg via ORAL
  Filled 2021-11-06: qty 1

## 2021-11-06 MED ORDER — ACETAMINOPHEN 325 MG PO TABS: 650.0000 mg | ORAL_TABLET | Freq: Four times a day (QID) | ORAL | Status: DC | PRN

## 2021-11-06 MED ORDER — LAMOTRIGINE 100 MG PO TABS
200.0000 mg | ORAL_TABLET | Freq: Two times a day (BID) | ORAL | Status: DC
  Administered 2021-11-06 – 2021-11-09 (×6): 200 mg via ORAL
  Filled 2021-11-06 (×6): qty 2

## 2021-11-06 MED ORDER — POLYETHYLENE GLYCOL 3350 17 G PO PACK
17.0000 g | PACK | Freq: Every day | ORAL | Status: DC
  Administered 2021-11-07 – 2021-11-09 (×3): 17 g via ORAL
  Filled 2021-11-06 (×3): qty 1

## 2021-11-06 NOTE — Progress Notes (Signed)
ON CALL PHONE CONSULT ? ?Call from [email or web address] ? ? ?Discussion ?88 year old with history of seizures on Lamictal 200 twice daily presented for evaluation from nursing home for episodes of unresponsiveness and staring spells where he has no recollection of those events and he loses time.  Facility was concerned for ongoing seizures versus stroke and sent him for evaluation to ED DB.  Per ED provider, examination is nonfocal and normal at this time but the episodes have happened multiple times and can happen anytime during the day. ?Due to his advanced age, history of seizures, multiplicity of stereotypic seizures, he would benefit from EEG-for spot EEG and then LTM EEG as well as an MRI to rule out any structural lesion. ?Labs at [facility] and [facility] unremarkable. ?I would recommend admission to hospitalist at [facility].  Please call the neurology team once he arrives at [facility].  Neurology will formally consult upon his arrival at [facility]. ? ?-- ?[facility], MD ?Neurologist ?[facility] ?Pager: [phone] ? ?

## 2021-11-06 NOTE — H&P (Addendum)
?History and Physical  ? ? ?PLEASE NOTE THAT DRAGON DICTATION SOFTWARE WAS USED IN THE CONSTRUCTION OF THIS NOTE. ? ? ?Brandon Gray HWE:[id number] DOB: Dec 27, 1927 DOA: 11/06/2021 ? ?PCP: Uvaldo Bristle, NP  ?Patient coming from: home  ? ?I have personally briefly reviewed patient's old medical records in [facility] ? ?Chief Complaint: Episodes of diminished responsiveness ? ?HPI: Brandon Gray is a 88 y.o. male with medical history significant for seizures, hyperlipidemia who is admitted to [facility] on 11/06/2021 by way of transfer from [facility] emergency department with concern for breakthrough of absence seizures after presenting from SNF to the latter facility complaining of episodes of diminished responsiveness. ? ?The following history is obtained via my discussions with the patient as well as via chart review. ? ?The patient reports that he was originally diagnosed with seizures approximately 10 years ago, at which time he was experiencing episodes of diminished responsiveness in which he was noted to be "staring off", unaware of his surroundings.  He has subsequently been on Lamictal, specifically 200 mg p.o. twice daily.  He notes no recent modifications to his outpatient antiepileptic regimen, including no recent dose adjustments.  He also notes outstanding compliance with his Lamictal, reporting no recent missed doses of such.  He denies any history of tonic-clonic seizures, and reports no similar episodes of diminished responsiveness/"staring off" over the last several years until over the last 2 to 3 weeks, he has experienced an estimated 10 such episodes matching the above description, with most recent such episode occurring around noon on 11/06/2021.  He confirms that he loses track of time when he experiences these episodes, and is not entirely sure as to the duration of these episodes.  Denies any associated tonic-clonic activity, nor any associated tongue biting or loss of  bowel/bladder function. Not a/w any recent acute focal weakness, acute focal numbness, paresthesias, slurred speech, facial droop, vertigo, nausea, vomiting, or acute change in vision.  No recent reported headache, neck stiffness, rash.  Given his report that he has not experienced any such episodes for several years leading up to experiencing approximately 10 episodes over the last 2 3 weeks, the patient verbalizes his concern not only for the return of these episodes, but is specific concern as to the frequency at which they are occurring.  Consequently, he presented to [facility] emergency department earlier today for further evaluation and management thereof. ? ?Denies any history of alcohol abuse or routine/recent alcohol consumption.  No history of recreational drug use, including no abuse of stimulant class recreational drugs, including no use of cocaine, methamphetamine, or amphetamines.  Denies any chronic or recent use of benzodiazepines, opioids, or antidepressant medications, including no recent use of SSRI's or SNRI's. No history of diabetes and does not take insulin or sulfonylureas as an outpatient. No known recent preceding trauma, fall, or injury.  ? ?No recent subjective fever, chills, rigors, or generalized myalgias. No recent chest pain, sore throat, sob, wheezing, cough, abdominal pain, diarrhea.  denies any recent dysuria, gross hematuria, or change in urinary urgency/frequency.  ? ?Of note, the patient conveys that after living in [place], [place] specifically, for his entire life, he moved to [place] into his current SNF approximately 1 year ago in order to be closer to his son.  He conveys that this change has been a notable adjustment for him, conferring a significant amount of stress ? ? ? ? ?[facility] ED Course:  ?Vital signs in the ED were notable for the  following: Afebrile; heart rate 68-72; blood pressure 122/64; respiratory rate 16-20, oxygen saturation 95 to 98% on  room air.  ? ?Labs were notable for the following: BMP was notable for the following: Sodium 130, bicarbonate 27, creatinine 1.19, glucose 92.  CBC notable for white blood cell count 7100. ? ?Imaging and additional notable ED work-up: EKG, in comparison to most recent prior from 08/29/2021, shows sinus rhythm with sinus arrhythmia and first-degree AV block, similar to most recent prior EKG, will demonstrate right bundle branch block and heart rate 78; today's EKG shows T wave inversion in leads V1 through V4, of which inversion in V1 through V3 was also noted on most recent prior EKG, while showing no evidence of ST changes, including no evidence of ST elevation. ? ?Noncontrast CT head showed no evidence of acute intracranial process, including no evidence of intracranial hemorrhage, acute infarct, mass, or midline shift, while demonstrating evidence of small vessel white matter disease. ? ?EDP at [facility] discussed patient's case with on-call neurology, Dr. Wilford CornerArora, who recommended admission to the hospitalist service at [facility] for further evaluation of potential breakthrough absence seizures, including pursuit of MRI brain to evaluate for structural lesion as well as evaluation via EEG.  ? ?While in [facility] ED, the following were administered: None ? ?Subsequently, the patient was admitted [facility] for overnight observation to med telemetry for further evaluation and management of potential breakthrough absence seizures in the context of a known history of seizure.  ? ? ? ? ?Review of Systems: As per HPI otherwise 10 point review of systems negative.  ? ?Past Medical History:  ?Diagnosis Date  ? Hearing loss   ? Hyperlipidemia   ? Seizures (HCC)   ? ? ?History reviewed. No pertinent surgical history. ? ?Social History: ? reports that he has never smoked. He has never used smokeless tobacco. He reports current alcohol use of about 7.0 standard drinks per week. He reports that he does not use  drugs. ? ? ?No Known Allergies ? ?Family History  ?Problem Relation Age of Onset  ? Diabetes Father   ? Cancer Daughter   ? Early death Daughter   ? ? ?Family history reviewed and not pertinent  ? ? ?Prior to Admission medications   ?Medication Sig Start Date End Date Taking? Authorizing Provider  ?lamoTRIgine (LAMICTAL) 200 MG tablet Take 1 tablet (200 mg total) by mouth 2 (two) times daily. 09/07/20   Ardith DarkParker, Caleb M, MD  ?polyethylene glycol (MIRALAX / GLYCOLAX) 17 g packet Take 17 g by mouth daily.    [provider]  ?simvastatin (ZOCOR) 20 MG tablet Take 1 tablet (20 mg total) by mouth daily. 09/07/20   Ardith DarkParker, Caleb M, MD  ? ? ? ?Objective  ? ? ?Physical Exam: ?Vitals:  ? 11/06/21 1530 11/06/21 1641 11/06/21 1700 11/06/21 2059  ?BP: (!) 152/82 122/64 (!) 128/59 133/63  ?Pulse: 68 68 64 72  ?Resp: 20 19 18 20   ?Temp:    98.1 ?F (36.7 ?C)  ?TempSrc:    Oral  ?SpO2: 95% 96% 97% 97%  ?Weight:      ?Height:      ? ? ?General: appears to be stated age; alert, oriented; a little hard of hearing ?Skin: warm, dry, no rash ?Head:  AT/ ?Mouth:  Oral mucosa membranes appear moist, normal dentition ?Neck: supple; trachea midline ?Heart:  RRR; did not appreciate any M/R/G ?Lungs: CTAB, did not appreciate any wheezes, rales, or rhonchi ?Abdomen: + BS; soft, ND,  NT ?Vascular: 2+ pedal pulses b/l; 2+ radial pulses b/l ?Extremities: no peripheral edema, no muscle wasting ?Neuro: Neuro: 5/5 strength of the proximal and distal flexors and extensors of the upper and lower extremities bilaterally; sensation intact in upper and lower extremities b/l; cranial nerves II through XII grossly intact; no pronator drift; no evidence suggestive of slurred speech, dysarthria, or facial droop; Normal muscle tone. No tremors. ? ? ? ?Labs on Admission: I have personally reviewed following labs and imaging studies ? ?CBC: ?Recent Labs  ?Lab 11/06/21 ?1554  ?WBC 7.1  ?NEUTROABS 4.2  ?HGB 12.9*  ?HCT 39.6  ?MCV 91.0  ?PLT 244  ? ?Basic  Metabolic Panel: ?Recent Labs  ?Lab 11/06/21 ?1554  ?NA 138  ?K 4.2  ?CL 101  ?CO2 27  ?GLUCOSE 92  ?BUN 31*  ?CREATININE 1.19  ?CALCIUM 10.1  ? ?GFR: ?Estimated Creatinine Clearance: 33.9 mL/min (by C-G formula base

## 2021-11-06 NOTE — ED Provider Notes (Addendum)
?[facility] EMERGENCY DEPT ?Provider Note ? ? ?CSN: [id number] ?Arrival date & time: 11/06/21  1414 ? ?  ? ?History ? ?Chief Complaint  ?Patient presents with  ? Loss of Consciousness  ? ? ?Brandon Gray is a 88 y.o. male. ? ?Patient is a 88 year old male presenting from [facility] with past medical history of seizures presenting for complaints of " spacing out".  Patient states over the last 2 to 3 weeks he has had several episodes of " spacing out" during the day and while getting up to go to the bathroom at night.  Patient describes episodes as " drawing a blank, finding myself sitting and staring, while doing normal habits such as being or going to the bathroom.  Patient states he had episodes like this 1 year prior when he was diagnosed with seizures.  Patient currently taking Lamictal 200 mg twice a day.  Denies missed doses.  Patient denies any recent falls or head trauma.  Sent from nursing home for concerns for strokelike symptoms or TIA. ? ?The history is provided by the patient Brandon Gray patient's son).  ?Loss of Consciousness ?Associated symptoms: no chest pain, no dizziness, no fever, no palpitations, no seizures, no shortness of breath and no vomiting   ? ?  ? ?Home Medications ?Prior to Admission medications   ?Medication Sig Start Date End Date Taking? Authorizing Provider  ?lamoTRIgine (LAMICTAL) 200 MG tablet Take 1 tablet (200 mg total) by mouth 2 (two) times daily. 09/07/20   Vivi Barrack, MD  ?polyethylene glycol (MIRALAX / GLYCOLAX) 17 g packet Take 17 g by mouth daily.    [provider]  ?simvastatin (ZOCOR) 20 MG tablet Take 1 tablet (20 mg total) by mouth daily. 09/07/20   Vivi Barrack, MD  ?   ? ?Allergies    ?Patient has no known allergies.   ? ?Review of Systems   ?Review of Systems  ?Constitutional:  Negative for chills and fever.  ?HENT:  Negative for ear pain and sore throat.   ?Eyes:  Negative for pain and visual disturbance.  ?Respiratory:   Negative for cough and shortness of breath.   ?Cardiovascular:  Positive for syncope. Negative for chest pain and palpitations.  ?Gastrointestinal:  Negative for abdominal pain and vomiting.  ?Genitourinary:  Negative for dysuria and hematuria.  ?Musculoskeletal:  Negative for arthralgias and back pain.  ?Skin:  Negative for color change and rash.  ?Neurological:  Negative for dizziness, seizures, syncope and numbness.  ?All other systems reviewed and are negative. ? ?Physical Exam ?Updated Vital Signs ?BP 117/70 (BP Location: Left Arm)   Pulse 69   Temp 98 ?F (36.7 ?C) (Oral)   Resp 18   Ht 5\' 4"  (1.626 m)   Wt 69.9 kg   SpO2 93%   BMI 26.43 kg/m?  ?Physical Exam ?Vitals and nursing note reviewed.  ?Constitutional:   ?   General: He is not in acute distress. ?   Appearance: He is well-developed.  ?HENT:  ?   Head: Normocephalic and atraumatic.  ?Eyes:  ?   General: Lids are normal. Vision grossly intact.  ?   Conjunctiva/sclera: Conjunctivae normal.  ?   Pupils: Pupils are equal, round, and reactive to light.  ?Cardiovascular:  ?   Rate and Rhythm: Normal rate and regular rhythm.  ?   Heart sounds: No murmur heard. ?Pulmonary:  ?   Effort: Pulmonary effort is normal. No respiratory distress.  ?   Breath sounds: Normal breath  sounds.  ?Abdominal:  ?   Palpations: Abdomen is soft.  ?   Tenderness: There is no abdominal tenderness.  ?Musculoskeletal:     ?   General: No swelling.  ?   Cervical back: Neck supple.  ?Skin: ?   General: Skin is warm and dry.  ?   Capillary Refill: Capillary refill takes less than 2 seconds.  ?Neurological:  ?   Mental Status: He is alert and oriented to person, place, and time.  ?   GCS: GCS eye subscore is 4. GCS verbal subscore is 5. GCS motor subscore is 6.  ?   Cranial Nerves: Cranial nerves 2-12 are intact.  ?   Sensory: Sensation is intact.  ?   Motor: Motor function is intact.  ?   Coordination: Coordination is intact.  ?   Gait: Gait is intact.  ?   Comments: Intention  tremor present in left upper extremity   ?Psychiatric:     ?   Mood and Affect: Mood normal.  ? ? ?ED Results / Procedures / Treatments   ?Labs ?(all labs ordered are listed, but only abnormal results are displayed) ?Labs Reviewed  ?CBC WITH DIFFERENTIAL/PLATELET - Abnormal; Notable for the following components:  ?    Result Value  ? Hemoglobin 12.9 (*)   ? All other components within normal limits  ?BASIC METABOLIC PANEL - Abnormal; Notable for the following components:  ? BUN 31 (*)   ? GFR, Estimated 58 (*)   ? All other components within normal limits  ?CBC WITH DIFFERENTIAL/PLATELET - Abnormal; Notable for the following components:  ? Hemoglobin 12.8 (*)   ? All other components within normal limits  ?COMPREHENSIVE METABOLIC PANEL - Abnormal; Notable for the following components:  ? Total Protein 5.8 (*)   ? All other components within normal limits  ?URINALYSIS, COMPLETE (UACMP) WITH MICROSCOPIC - Abnormal; Notable for the following components:  ? Color, Urine STRAW (*)   ? All other components within normal limits  ?MAGNESIUM  ?RAPID URINE DRUG SCREEN, HOSP PERFORMED  ?CBG MONITORING, ED  ?TROPONIN I (HIGH SENSITIVITY)  ?TROPONIN I (HIGH SENSITIVITY)  ? ? ?EKG ?EKG Interpretation ? ?Date/Time:  Saturday November 06 2021 14:26:08 EDT ?Ventricular Rate:  78 ?PR Interval:  248 ?QRS Duration: 150 ?QT Interval:  428 ?QTC Calculation: 487 ?R Axis:   -28 ?Text Interpretation: Sinus rhythm with marked sinus arrhythmia with 1st degree A-V block Right bundle branch block Abnormal ECG When compared with ECG of 29-Aug-2021 15:39, PREVIOUS ECG IS PRESENT No significant change since last tracing Confirmed by Lacretia Leigh ([id number]) on 11/06/2021 2:40:12 PM ? ?Radiology ?CT Head Wo Contrast ? ?Result Date: 11/06/2021 ?CLINICAL DATA:  Altered mental status possible syncope EXAM: CT HEAD WITHOUT CONTRAST TECHNIQUE: Contiguous axial images were obtained from the base of the skull through the vertex without intravenous contrast.  RADIATION DOSE REDUCTION: This exam was performed according to the departmental dose-optimization program which includes automated exposure control, adjustment of the mA and/or kV according to patient size and/or use of iterative reconstruction technique. COMPARISON:  02/23/2021 FINDINGS: Brain: No evidence of acute infarction, hemorrhage, hydrocephalus, extra-axial collection or mass lesion/mass effect. Periventricular and deep white matter hypodensity. Vascular: No hyperdense vessel or unexpected calcification. Skull: Normal. Negative for fracture or focal lesion. Sinuses/Orbits: No acute finding. Other: None. IMPRESSION: No acute intracranial pathology. Small-vessel white matter disease. Electronically Signed   By: Delanna Ahmadi M.D.   On: 11/06/2021 16:20  ? ?MR BRAIN W WO CONTRAST ? ?  Result Date: 11/07/2021 ?CLINICAL DATA:  Seizure EXAM: MRI HEAD WITHOUT AND WITH CONTRAST TECHNIQUE: Multiplanar, multiecho pulse sequences of the brain and surrounding structures were obtained without and with intravenous contrast. CONTRAST:  22mL GADAVIST GADOBUTROL 1 MMOL/ML IV SOLN COMPARISON:  None. FINDINGS: Brain: Small acute infarct within the posterior right parietal lobe. No acute or chronic hemorrhage. There is multifocal hyperintense T2-weighted signal within the white matter. Generalized volume loss without a clear lobar predilection. The midline structures are normal. Vascular: Major flow voids are preserved. Skull and upper cervical spine: Normal calvarium and skull base. Visualized upper cervical spine and soft tissues are normal. Sinuses/Orbits:No paranasal sinus fluid levels or advanced mucosal thickening. No mastoid or middle ear effusion. Normal orbits. IMPRESSION: 1. Small acute infarct within the posterior right parietal lobe. No hemorrhage or mass effect. 2. Findings of chronic microvascular disease and generalized volume loss. Electronically Signed   By: Ulyses Jarred M.D.   On: 11/07/2021 03:19  ? ?EEG  adult ? ?Result Date: 11/07/2021 ?Lora Havens, MD     11/07/2021  8:40 AM Patient Name: Brandon Gray MRN: [id number] Epilepsy Attending: Lora Havens Referring Physician/Provider: Kerney Elbe, MD Date: 3/26/

## 2021-11-06 NOTE — Assessment & Plan Note (Addendum)
Recurrent episodes of diminished responsiveness, staring off, 10 episodes over last 3 weeks ?On lamictal at home ?Concern for breakthrough absence seizures ?Neurology recommended LTM EEG and MRI ?MRI showed acute infarct within posterior right parietal lobe ?EEG without seizure or epileptiform discharges ?Seizure precautions ?UA not c/w UTI, negative rapid urine drug screen ?Neurology no seizures or epileptiform discharges seen throughout the recording, no changes to home seizure meds ?Follow outpatient ?  ?

## 2021-11-06 NOTE — Consult Note (Signed)
?                    NEURO HOSPITALIST CONSULT NOTE  ? ?Requesting physician: Dr. Arlean HoppingHowerter ? ?Reason for Consult: Breakthrough seizures ? ?History obtained from:   Chart    ? ?HPI:                                                                                                                                         ? Brandon Gray is an 88 y.o. male with a PMHx of HLD, hearing loss and seizures, on Lamictal 200 mg BID, who presented for evaluation from nursing home for episodes of unresponsiveness and staring spells where he has no recollection of those events and he loses time. The episodes have happened multiple times and can happen anytime during the day. They have occurred over the past 2-3 weeks, but he also had similar events last year, per chart. Patient reported that while reading he would feel like he "spaced out". Facility was concerned for ongoing seizures versus stroke and sent him for evaluation to [facility]. Initial examination by EDP was nonfocal. Due to his advanced age, history of seizures and multiplicity of the stereotyped events, it was determined that he would benefit from EEG monitoring as well as an MRI to rule out any structural lesion. [facility]. ? ?Past Medical History:  ?Diagnosis Date  ? Hearing loss   ? Hyperlipidemia   ? Seizures (HCC)   ? ? ?History reviewed. No pertinent surgical history. ? ?Family History  ?Problem Relation Age of Onset  ? Diabetes Father   ? Cancer Daughter   ? Early death Daughter   ?        ? ?Social History:  reports that he has never smoked. He has never used smokeless tobacco. He reports current alcohol use of about 7.0 standard drinks per week. He reports that he does not use drugs. ? ?No Known Allergies ? ?MEDICATIONS:                                                                                                                     ?Prior to Admission:  ?Medications Prior to Admission  ?Medication Sig  Dispense Refill Last Dose  ? lamoTRIgine (LAMICTAL) 200 MG tablet Take 1 tablet (200 mg total) by mouth 2 (two) times daily. 180 tablet  3   ? polyethylene glycol (MIRALAX / GLYCOLAX) 17 g packet Take 17 g by mouth daily.     ? simvastatin (ZOCOR) 20 MG tablet Take 1 tablet (20 mg total) by mouth daily. 90 tablet 3   ? ?Scheduled: ? lamoTRIgine  200 mg Oral BID  ? [START ON 11/07/2021] polyethylene glycol  17 g Oral Daily  ? [START ON 11/07/2021] simvastatin  20 mg Oral Daily  ? ? ?ROS:                                                                                                                                       ?As per HPI.  ? ? ?Blood pressure 133/63, pulse 72, temperature 98.1 ?F (36.7 ?C), temperature source Oral, resp. rate 20, height 5\' 4"  (1.626 m), weight 69.9 kg, SpO2 97 %. ? ? ?General Examination:                                                                                                      ? ?Physical Exam  ?HEENT-  [place]/AT    ?Lungs- Respirations unlabored ?Extremities- No edema ? ? ?Neurological Examination ?Mental Status: Awake and alert. Severely hard of hearing, but will follow all commands which are repeated to him loudly. Speech is fluent.  ?Cranial Nerves: ?II: Temporal visual fields intact with no extinction to DSS. PERRL.   ?III,IV, VI: No ptosis. EOMI.  ?V: Temp sensation equal bilaterally ?VII: Smile symmetric ?VIII: Severely hard of hearing ?IX,X: No hoarseness or hypophonia ?XI: Head is midline ?XII: Midline tongue extension ?Motor: ?Right : Upper extremity   5/5    Left:     Upper extremity   5/5 ? Lower extremity   5/5     Lower extremity   5/5 ?Sensory: Temp sensation intact x 4.   ?Deep Tendon Reflexes: 1+ bilateral brachioradialis. Trace patellar reflexes bilaterally ?Cerebellar: No ataxia with FNF bilaterally  ?Gait: Deferred ?Other: No clinical seizure activity seen ?  ?Lab Results: ?Basic Metabolic Panel: ?Recent Labs  ?Lab 11/06/21 ?1554  ?NA 138  ?K 4.2  ?CL 101  ?CO2 27   ?GLUCOSE 92  ?BUN 31*  ?CREATININE 1.19  ?CALCIUM 10.1  ? ? ?CBC: ?Recent Labs  ?Lab 11/06/21 ?1554  ?WBC 7.1  ?NEUTROABS 4.2  ?HGB 12.9*  ?HCT 39.6  ?MCV 91.0  ?PLT 244  ? ? ?Cardiac Enzymes: ?No results for input(s): CKTOTAL, CKMB, CKMBINDEX, TROPONINI in the last 168 hours. ? ?Lipid Panel: ?No results for  input(s): CHOL, TRIG, HDL, CHOLHDL, VLDL, LDLCALC in the last 168 hours. ? ?Imaging: ?CT Head Wo Contrast ? ?Result Date: 11/06/2021 ?CLINICAL DATA:  Altered mental status possible syncope EXAM: CT HEAD WITHOUT CONTRAST TECHNIQUE: Contiguous axial images were obtained from the base of the skull through the vertex without intravenous contrast. RADIATION DOSE REDUCTION: This exam was performed according to the departmental dose-optimization program which includes automated exposure control, adjustment of the mA and/or kV according to patient size and/or use of iterative reconstruction technique. COMPARISON:  02/23/2021 FINDINGS: Brain: No evidence of acute infarction, hemorrhage, hydrocephalus, extra-axial collection or mass lesion/mass effect. Periventricular and deep white matter hypodensity. Vascular: No hyperdense vessel or unexpected calcification. Skull: Normal. Negative for fracture or focal lesion. Sinuses/Orbits: No acute finding. Other: None. IMPRESSION: No acute intracranial pathology. Small-vessel white matter disease. Electronically Signed   By: Jearld Lesch M.D.   On: 11/06/2021 16:20   ? ? ?Assessment: 88 year old male with a history of seizures, presenting with a 2-3 week history of multiple stereotyped spells of unresponsiveness with staring ?1. Exam is nonfocal ?2. CT head:  No acute intracranial pathology. Small-vessel white matter disease.  ?3. DDx for his spells is broad, including behavioral, decreased ability to interact due to being severely hard of hearing, and partial complex seizures.  ? ?Recommendations: ?1. Agree with obtaining MRI brain ?2. LTM EEG (ordered) ?3. Inpatient seizure  precautions ? ? ?Electronically signed: Dr. Caryl Pina ?11/06/2021, 11:00 PM ? ? ? ?   ?

## 2021-11-06 NOTE — ED Triage Notes (Signed)
Patient brought in by family for complaint of questionable syncopal episodes. Patient from [place]. Patient reports while reading he would feel like he spaced out. Family reports patient seem confused this morning with some improvement. Patient has history of same last year. Patient also has history of seizures.  ?

## 2021-11-06 NOTE — Assessment & Plan Note (Addendum)
LDL pending ?On simvastatin ?  ?

## 2021-11-07 ENCOUNTER — Observation Stay (HOSPITAL_COMMUNITY): Payer: Medicare Other

## 2021-11-07 DIAGNOSIS — Z8669 Personal history of other diseases of the nervous system and sense organs: Secondary | ICD-10-CM

## 2021-11-07 DIAGNOSIS — R4189 Other symptoms and signs involving cognitive functions and awareness: Secondary | ICD-10-CM

## 2021-11-07 DIAGNOSIS — I639 Cerebral infarction, unspecified: Secondary | ICD-10-CM | POA: Diagnosis not present

## 2021-11-07 DIAGNOSIS — I6501 Occlusion and stenosis of right vertebral artery: Secondary | ICD-10-CM | POA: Diagnosis not present

## 2021-11-07 DIAGNOSIS — I6523 Occlusion and stenosis of bilateral carotid arteries: Secondary | ICD-10-CM | POA: Diagnosis not present

## 2021-11-07 DIAGNOSIS — Z8673 Personal history of transient ischemic attack (TIA), and cerebral infarction without residual deficits: Secondary | ICD-10-CM | POA: Diagnosis not present

## 2021-11-07 DIAGNOSIS — R569 Unspecified convulsions: Secondary | ICD-10-CM | POA: Diagnosis not present

## 2021-11-07 DIAGNOSIS — I672 Cerebral atherosclerosis: Secondary | ICD-10-CM | POA: Diagnosis not present

## 2021-11-07 LAB — CBC WITH DIFFERENTIAL/PLATELET
Abs Immature Granulocytes: 0.02 10*3/uL (ref 0.00–0.07)
Basophils Absolute: 0 10*3/uL (ref 0.0–0.1)
Basophils Relative: 0 %
Eosinophils Absolute: 0.1 10*3/uL (ref 0.0–0.5)
Eosinophils Relative: 2 %
HCT: 39.1 % (ref 39.0–52.0)
Hemoglobin: 12.8 g/dL — ABNORMAL LOW (ref 13.0–17.0)
Immature Granulocytes: 0 %
Lymphocytes Relative: 29 %
Lymphs Abs: 1.7 10*3/uL (ref 0.7–4.0)
MCH: 29.8 pg (ref 26.0–34.0)
MCHC: 32.7 g/dL (ref 30.0–36.0)
MCV: 91.1 fL (ref 80.0–100.0)
Monocytes Absolute: 0.6 10*3/uL (ref 0.1–1.0)
Monocytes Relative: 11 %
Neutro Abs: 3.4 10*3/uL (ref 1.7–7.7)
Neutrophils Relative %: 58 %
Platelets: 227 10*3/uL (ref 150–400)
RBC: 4.29 MIL/uL (ref 4.22–5.81)
RDW: 13.9 % (ref 11.5–15.5)
WBC: 5.8 10*3/uL (ref 4.0–10.5)
nRBC: 0 % (ref 0.0–0.2)

## 2021-11-07 LAB — COMPREHENSIVE METABOLIC PANEL
ALT: 15 U/L (ref 0–44)
AST: 19 U/L (ref 15–41)
Albumin: 3.8 g/dL (ref 3.5–5.0)
Alkaline Phosphatase: 64 U/L (ref 38–126)
Anion gap: 7 (ref 5–15)
BUN: 18 mg/dL (ref 8–23)
CO2: 26 mmol/L (ref 22–32)
Calcium: 9.6 mg/dL (ref 8.9–10.3)
Chloride: 106 mmol/L (ref 98–111)
Creatinine, Ser: 1.01 mg/dL (ref 0.61–1.24)
GFR, Estimated: >60 mL/min (ref >60)
Glucose, Bld: 92 mg/dL (ref 70–99)
Potassium: 4.1 mmol/L (ref 3.5–5.1)
Sodium: 139 mmol/L (ref 135–145)
Total Bilirubin: 0.8 mg/dL (ref 0.3–1.2)
Total Protein: 5.8 g/dL — ABNORMAL LOW (ref 6.5–8.1)

## 2021-11-07 LAB — MAGNESIUM: Magnesium: 2 mg/dL (ref 1.7–2.4)

## 2021-11-07 LAB — GLUCOSE, CAPILLARY: Glucose-Capillary: 156 mg/dL — ABNORMAL HIGH (ref 70–99)

## 2021-11-07 IMAGING — MR MR HEAD WO/W CM
16 of 18 series · 40 of 48 positions shown · IV contrast (Gadavist)
Comparison: None.

CLINICAL DATA: Seizure

EXAM:
MRI HEAD WITHOUT AND WITH CONTRAST
TECHNIQUE: Multiplanar, multiecho pulse sequences of the brain and surrounding
structures were obtained without and with intravenous contrast.
CONTRAST:  7mL GADAVIST GADOBUTROL 1 MMOL/ML IV SOLN

[Series 5: DWI · axial · 3.0mm · 0.88mm/px · z∈[-104,+42]mm · 5 of 100 slices shown (1 of 4)]
[im 1/100]
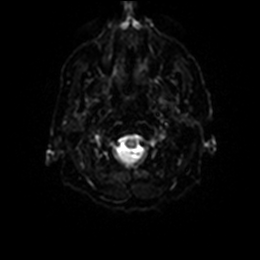
[im 25/100]
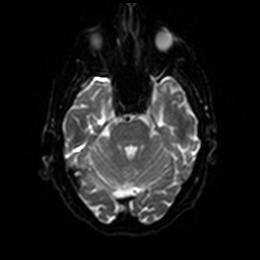
[im 50/100]
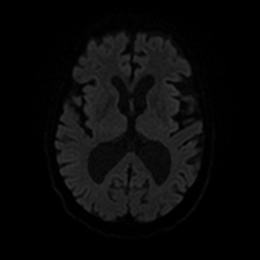
[im 75/100]
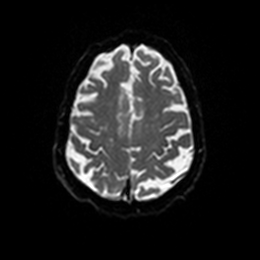
[im 100/100]
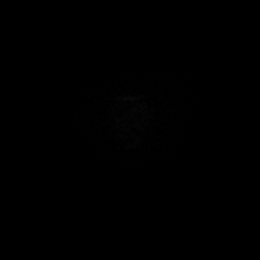

[Series 6: DWI · axial · 3.0mm · 0.88mm/px · z∈[-104,+42]mm · 2 of 50 slices shown (2 of 4)]
[im 1/50]
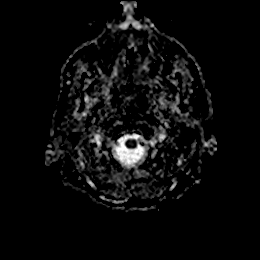
[im 50/50]
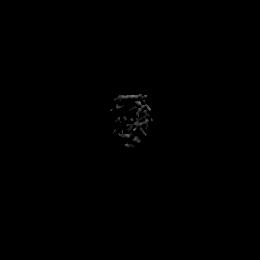

[Series 7: DWI · coronal · 4.0mm · 0.88mm/px · 3 of 64 slices shown (3 of 4)]
[im 1/64]
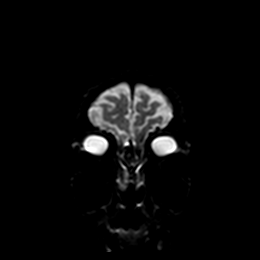
[im 32/64]
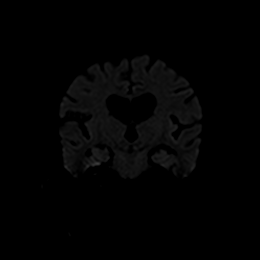
[im 64/64]
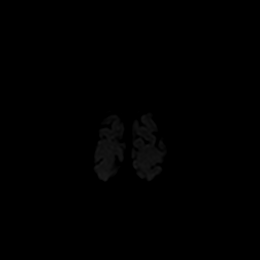

[Series 8: DWI · coronal · 4.0mm · 0.88mm/px · 2 of 32 slices shown (4 of 4)]
[im 1/32]
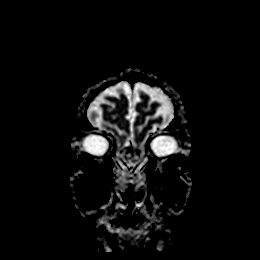
[im 32/32]
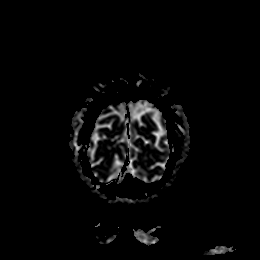

[Series 9: T1 · sagittal · 5.0mm · 0.75mm/px · 1 of 23 slices shown]
[im 1/23]
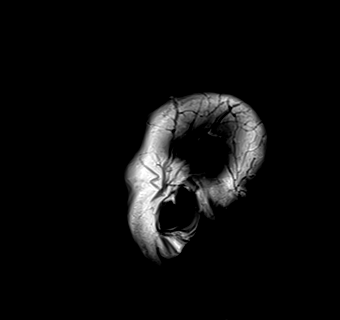

[Series 10: T2 · axial · 5.0mm · 0.72mm/px · 1 of 25 slices shown (1 of 2)]
[im 1/25]
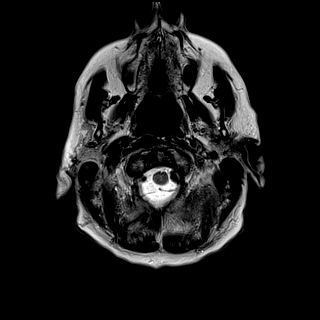

[Series 11: FLAIR · axial · 5.0mm · 0.45mm/px · 1 of 25 slices shown (1 of 2)]
[im 1/25]
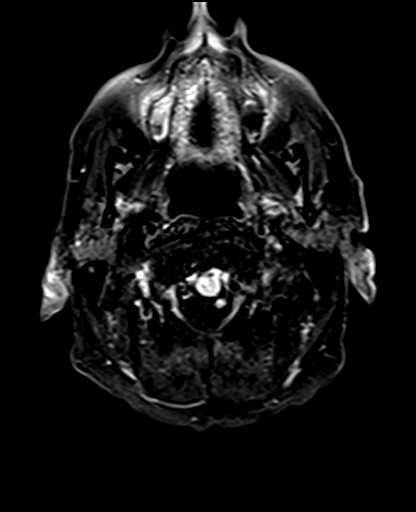

[Series 13: pha_images · axial · 3.0mm · 0.90mm/px · z∈[-120,+43]mm · 3 of 56 slices shown]
[im 1/56]
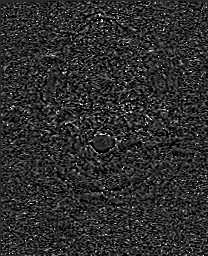
[im 28/56]
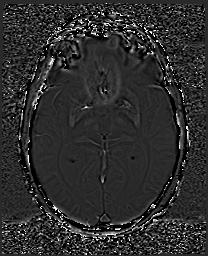
[im 56/56]
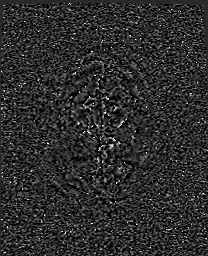

[Series 14: swi_images · axial · 3.0mm · 0.90mm/px · z∈[-120,+55]mm · 3 of 60 slices shown]
[im 1/60]
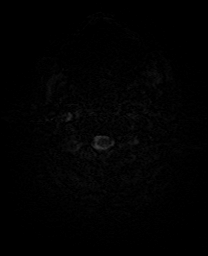
[im 30/60]
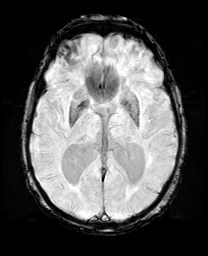
[im 60/60]
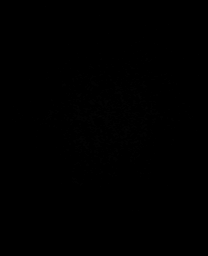

[Series 17: t1_mprage_tra_p2_iso · axial · 1.0mm · 0.98mm/px · z∈[-110,+64]mm · 8 of 176 slices shown]
[im 1/176]
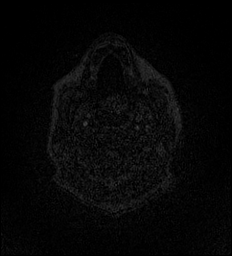
[im 22/176]
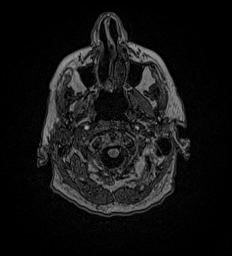
[im 44/176]
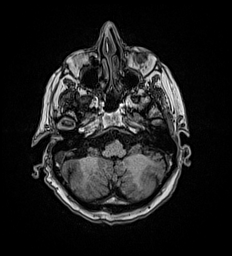
[im 66/176]
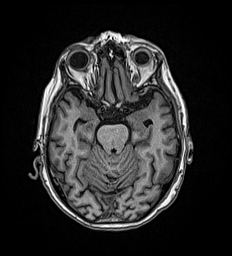
[im 110/176]
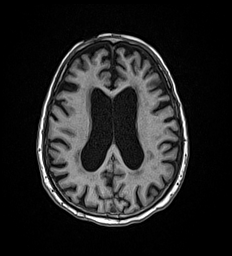
[im 132/176]
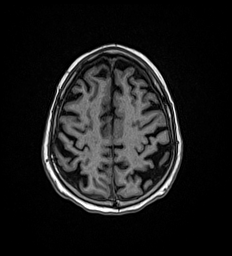
[im 154/176]
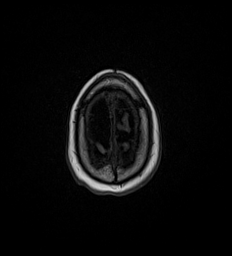
[im 176/176]
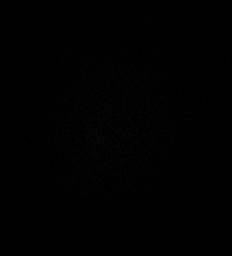

[Series 18: t1_mprage_tra_p2_iso_mpr_coronal · coronal · 1.0mm · 0.45mm/px · 5 of 120 slices shown]
[im 1/120]
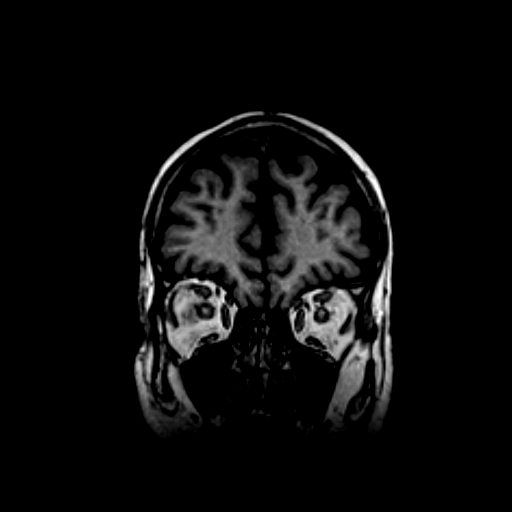
[im 24/120]
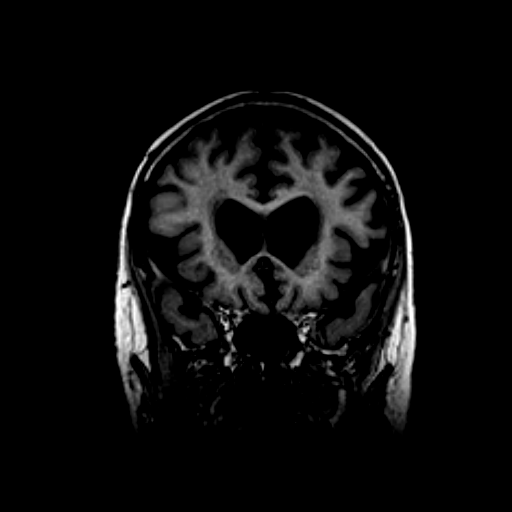
[im 48/120]
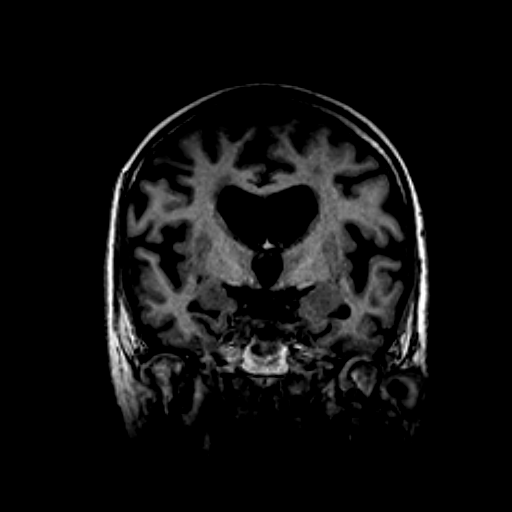
[im 72/120]
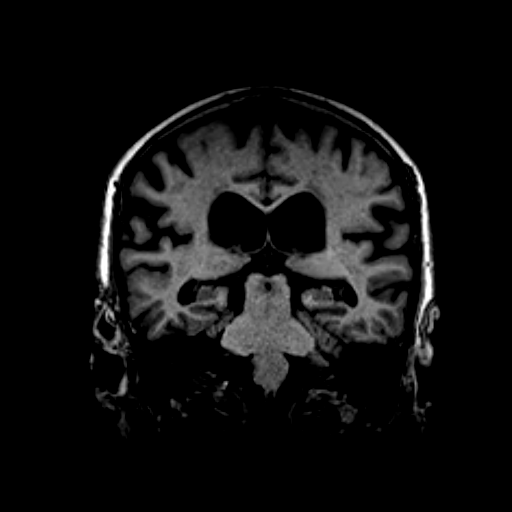
[im 96/120]
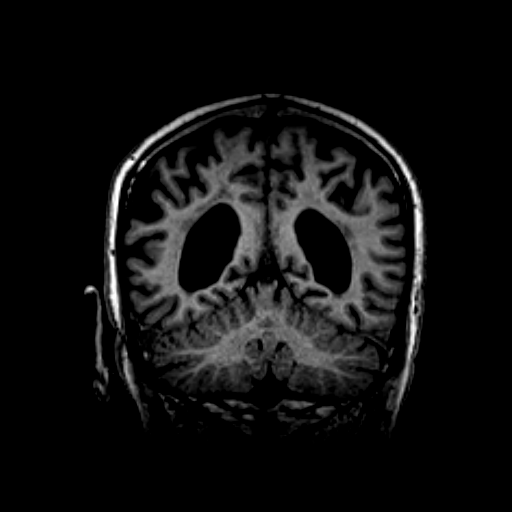

[Series 19: T2 · coronal · 3.0mm · 0.27mm/px · 2 of 32 slices shown (2 of 2)]
[im 1/32]
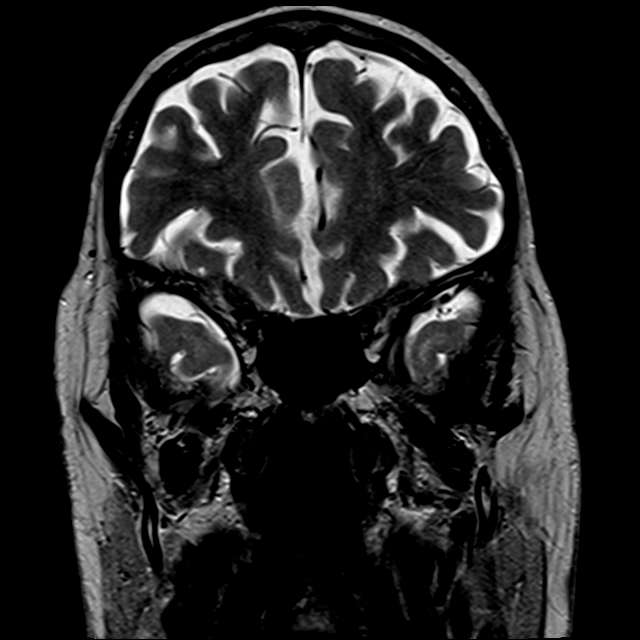
[im 32/32]
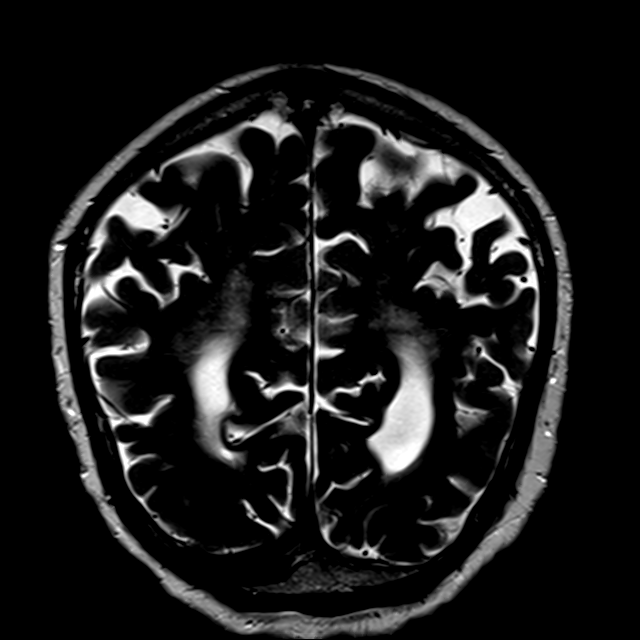

[Series 20: FLAIR · coronal · 3.0mm · 0.56mm/px · 1 of 21 slices shown (2 of 2)]
[im 1/21]
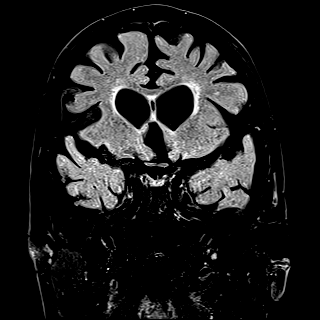

[Series 21: T2 post-contrast · coronal · 5.0mm · 0.72mm/px · 1 of 28 slices shown]
[im 1/28]
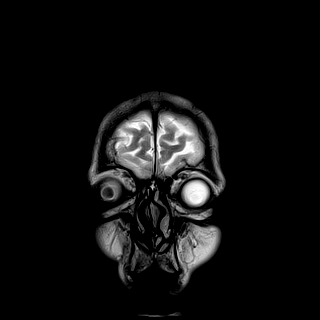

[Series 23: T1 post-contrast · coronal · 5.0mm · 0.34mm/px · 1 of 28 slices shown (1 of 2)]
[im 1/28]
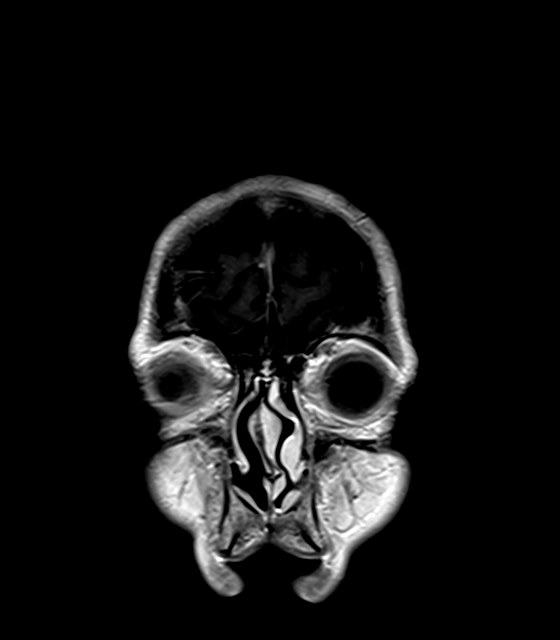

[Series 24: T1 post-contrast · sagittal · 5.0mm · 0.75mm/px · 1 of 23 slices shown (2 of 2)]
[im 1/23]
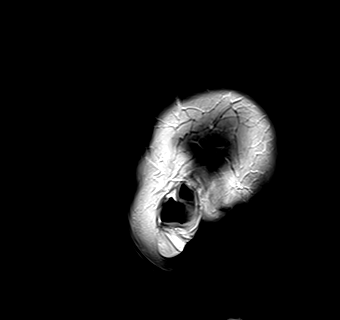

[40 of 48 positions shown; findings below may reference images not displayed]

FINDINGS: Brain: Small acute infarct within the posterior right parietal lobe.
No acute or chronic hemorrhage. There is multifocal hyperintense
T2-weighted signal within the white matter. Generalized volume loss
without a clear lobar predilection. The midline structures are
normal.

Vascular: Major flow voids are preserved.

Skull and upper cervical spine: Normal calvarium and skull base.
Visualized upper cervical spine and soft tissues are normal.

Sinuses/Orbits:No paranasal sinus fluid levels or advanced mucosal
thickening. No mastoid or middle ear effusion. Normal orbits.
IMPRESSION: 1. Small acute infarct within the posterior right parietal lobe. No
hemorrhage or mass effect.
2. Findings of chronic microvascular disease and generalized volume
loss.

## 2021-11-07 IMAGING — CT CT ANGIO HEAD-NECK (W OR W/O PERF)
1 of 11 series · 5 of 33 positions shown · IV contrast (APPLIED)
Comparison: Noncontrast CT head dated 1 day prior, same-day brain
MRI

CLINICAL DATA: Follow-up stroke

EXAM:
CT ANGIOGRAPHY HEAD AND NECK
TECHNIQUE: Multidetector CT imaging of the head and neck was performed using
the standard protocol during bolus administration of intravenous
contrast. Multiplanar CT image reconstructions and MIPs were
obtained to evaluate the vascular anatomy. Carotid stenosis
measurements (when applicable) are obtained utilizing NASCET
criteria, using the distal internal carotid diameter as the
denominator.

[Series 11: ax thins · axial · 0.39mm/px · z∈[-162,+48]mm · 5 of 320 slices shown]
[im 54/320  soft-tissue]
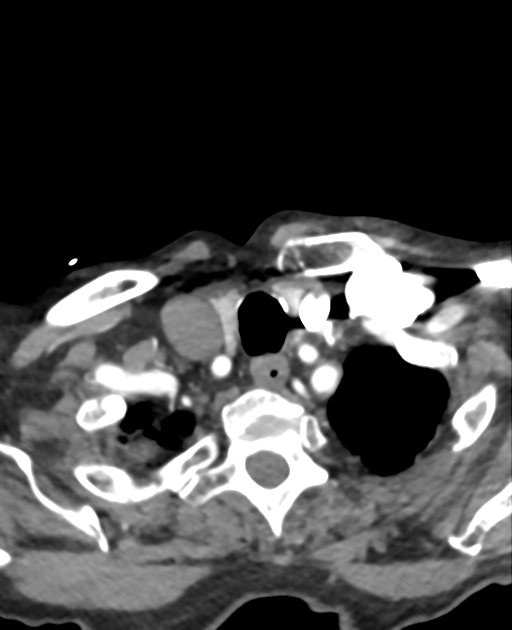
[im 107/320  bone]
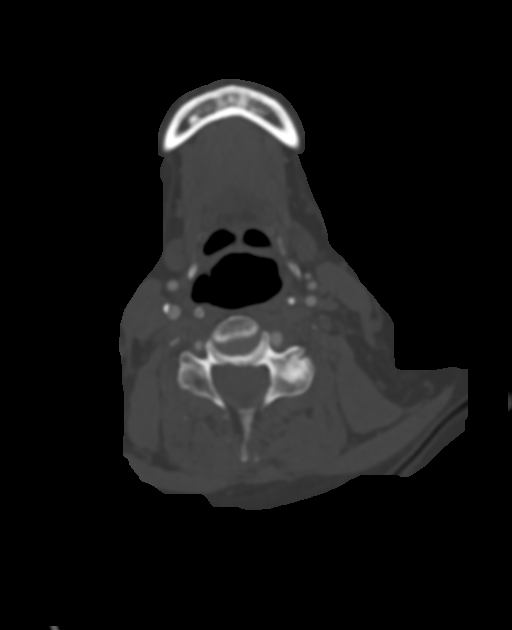
[im 160/320  soft-tissue]
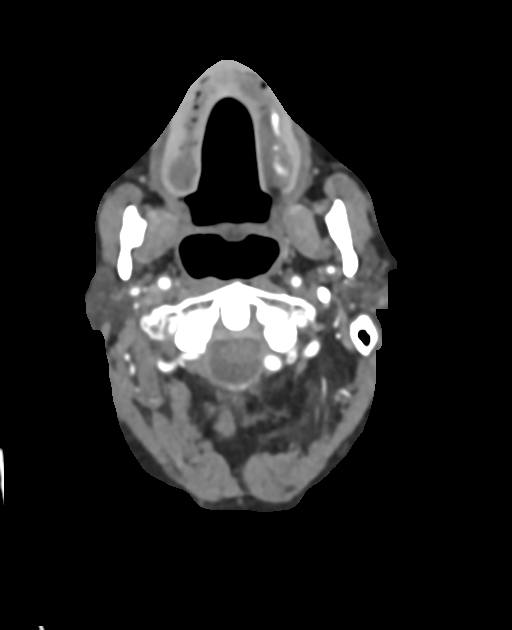
[im 213/320  bone]
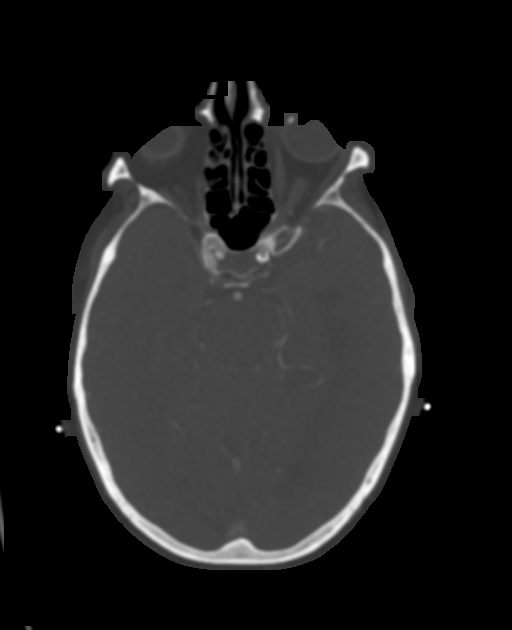
[im 266/320  soft-tissue]
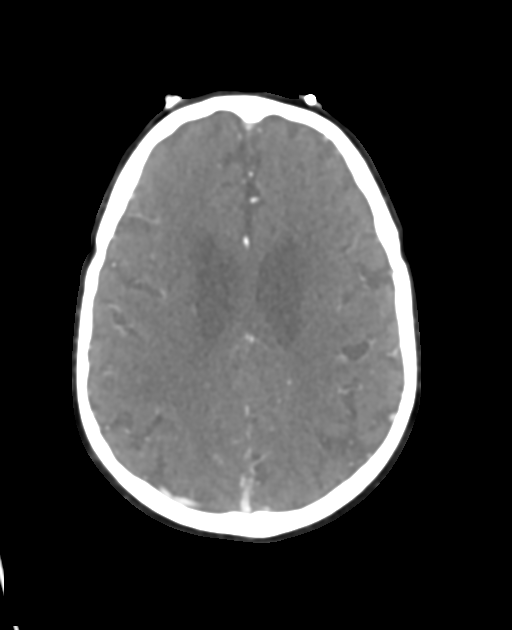

[5 of 33 positions shown; findings below may reference images not displayed]

RADIATION DOSE REDUCTION: This exam was performed according to the
departmental dose-optimization program which includes automated
exposure control, adjustment of the mA and/or kV according to
patient size and/or use of iterative reconstruction technique.

CONTRAST:  100mL OMNIPAQUE IOHEXOL 350 MG/ML SOLN
FINDINGS: CT HEAD FINDINGS

Brain: The known small infarcts in the right parietal lobe are not
well seen on the current study. There is no evidence of evolved
territorial infarct. There is no evidence of acute intracranial
hemorrhage or extra-axial fluid collection.

There is global parenchymal volume loss with prominence of the
ventricular system and extra-axial CSF spaces. The ventricles are
stable in size. Patchy hypodensity in the subcortical and
periventricular white matter likely reflects sequela of chronic
white matter microangiopathy.

There is no mass lesion.  There is no midline shift.

Vascular: See below.

Skull: Normal. Negative for fracture or focal lesion.

Sinuses: The paranasal sinuses are clear.

Orbits: A left lens implant is noted. The globes and orbits are
unremarkable.

Review of the MIP images confirms the above findings

CTA NECK FINDINGS

Aortic arch: The imaged aortic arch is unremarkable. Origin of the
brachiocephalic artery is not included within the field of view. The
origins of the left common carotid and subclavian arteries are
patent. The subclavian arteries are patent to the level imaged.

Right carotid system: The right common carotid artery is patent.
There is mixed plaque in the proximal right internal carotid artery
resulting in up to approximally 30-40% stenosis. The distal right
internal carotid artery is patent. There is severe stenosis at the
origin of the right external carotid artery with patent distal
branches. There is no evidence of dissection or aneurysm.

Left carotid system: The left common carotid artery is patent. There
is prominent mixed plaque in the distal left common carotid
artery/carotid bulb resulting in severe stenosis. The exact luminal
diameter is difficult to measure, but the stenosis is estimated to
be approximately 80%. The distal left internal carotid artery is
patent. There is moderate stenosis at the origin of the left
external carotid artery. There is no evidence of dissection or
aneurysm.

Vertebral arteries: The vertebral arteries are patent, without
hemodynamically significant stenosis or occlusion. There is no
evidence of dissection or aneurysm.

Skeleton: There is mild multilevel degenerative change of the
cervical spine. There is no acute osseous abnormality or aggressive
osseous lesion. There is no visible canal hematoma.

Other neck: Soft tissues are unremarkable.

Upper chest: The imaged lung apices are clear.

Review of the MIP images confirms the above findings

CTA HEAD FINDINGS

Anterior circulation: There is calcified atherosclerotic plaque in
the bilateral intracranial ICAs resulting in up to moderate stenosis
of the left supraclinoid segment and no greater than mild stenosis
on the right

The bilateral MCAs are patent.

Left A1 segment is hypoplastic, a developmental variant. Otherwise,
the bilateral ACAs are patent. The anterior communicating artery is
normal.

There is no aneurysm or AVM.

Posterior circulation: There is mild plaque in the bilateral V4
segments resulting in mild stenosis on the right and no significant
stenosis on the left. The basilar artery is patent.

The bilateral PCAs are patent. The posterior communicating arteries
are not definitely seen.

There is no aneurysm or AVM.

Venous sinuses: As permitted by contrast timing, patent.

Anatomic variants: None.

Review of the MIP images confirms the above findings
IMPRESSION: 1. The small known infarcts in the right parietal lobe are not well
seen on the current study. No new acute intracranial pathology.
2. No large vessel occlusion.
3. Mild intracranial atherosclerotic disease in the bilateral
intracranial ICAs and bilateral V4 segments resulting in up to
moderate stenosis of the left supraclinoid segment. Otherwise,
patent intracranial vasculature.
4. Mixed plaque in the proximal left ICA resulting in severe
stenosis, estimated at approximally 80%. Mixed plaque at the
proximal right ICA resulting in approximally 30-40% stenosis.
5. Severe stenosis at the origin of the right external carotid
artery, and moderate stenosis at the origin of the left external
carotid artery.
6. Patent vertebral arteries in the neck.

## 2021-11-07 MED ORDER — IOHEXOL 350 MG/ML SOLN
100.0000 mL | Freq: Once | INTRAVENOUS | Status: AC | PRN
  Administered 2021-11-07: 100 mL via INTRAVENOUS

## 2021-11-07 MED ORDER — GADOBUTROL 1 MMOL/ML IV SOLN
7.0000 mL | Freq: Once | INTRAVENOUS | Status: AC | PRN
  Administered 2021-11-07: 7 mL via INTRAVENOUS

## 2021-11-07 MED ORDER — STROKE: EARLY STAGES OF RECOVERY BOOK
Freq: Once | Status: AC
  Filled 2021-11-07: qty 1

## 2021-11-07 MED ORDER — ASPIRIN 325 MG PO TABS
325.0000 mg | ORAL_TABLET | Freq: Every day | ORAL | Status: DC
  Administered 2021-11-07 – 2021-11-08 (×2): 325 mg via ORAL
  Filled 2021-11-07 (×2): qty 1

## 2021-11-07 MED ORDER — ENOXAPARIN SODIUM 40 MG/0.4ML IJ SOSY
40.0000 mg | PREFILLED_SYRINGE | INTRAMUSCULAR | Status: DC
  Administered 2021-11-07 – 2021-11-08 (×2): 40 mg via SUBCUTANEOUS
  Filled 2021-11-07 (×2): qty 0.4

## 2021-11-07 MED ORDER — ASPIRIN 300 MG RE SUPP: 300.0000 mg | Freq: Every day | RECTAL | Status: DC

## 2021-11-07 NOTE — Progress Notes (Signed)
LTM EEG hooked up and running - no initial skin breakdown - push button tested - neuro notified. [facility] monitoring.  

## 2021-11-07 NOTE — Assessment & Plan Note (Addendum)
Appreciate neurology assistance, suspects likely 2/2 small vessel disease ?MRI with acute infarct within posterior right parietal lobe ?Neurology recs - recommending DAPT x3 weeks, then aspirin alone, s/p loop recorder placement - neurology f/u outpatient ?S/p loop recorder 3/27 ?Echo with ? Apical hypokinesis, LVEF 55-60% ?CTA head/neck with mixed plaque in proximal L ICA resulting in severe stenosis (80%), mixed plaque at proximal R ICA (30-40% stenosis) -. Neuro planning to follow asymptomatic stenosis outpatient ?PT/OT/SLP -> home health ?LDL 83 (simvastatin increased to 40 mg) and A1c 5.6 ?

## 2021-11-07 NOTE — Progress Notes (Signed)
Pts breakfast ordered. SCDs applied. Pt has hearing aids and dentures each in a container.  ?

## 2021-11-07 NOTE — Progress Notes (Addendum)
STROKE TEAM PROGRESS NOTE  ? ?INTERVAL HISTORY ?From SNF after episodes of diminished responsiveness with no recollection of the events. He's sitting comfortably in bed reading his book. Does not report any tingling, numbness, abnormal sensation. Still hooked up to continuous EEG. CTA head/neck pending. ? ?Vitals:  ? 11/06/21 2059 11/06/21 2327 11/07/21 0352 11/07/21 0736  ?BP: 133/63 (!) 119/50 (!) 116/54 117/70  ?Pulse: 72 64 61 69  ?Resp: 20 20 20 18   ?Temp: 98.1 ?F (36.7 ?C) 97.7 ?F (36.5 ?C) 97.7 ?F (36.5 ?C) 98 ?F (36.7 ?C)  ?TempSrc: Oral Oral Axillary Oral  ?SpO2: 97% 94% 94% 93%  ?Weight:      ?Height:      ? ?CBC:  ?Recent Labs  ?Lab 11/06/21 ?1554 11/07/21 ?[id number]  ?WBC 7.1 5.8  ?NEUTROABS 4.2 3.4  ?HGB 12.9* 12.8*  ?HCT 39.6 39.1  ?MCV 91.0 91.1  ?PLT 244 227  ? ?Basic Metabolic Panel:  ?Recent Labs  ?Lab 11/06/21 ?1554 11/07/21 ?[id number]  ?NA 138 139  ?K 4.2 4.1  ?CL 101 106  ?CO2 27 26  ?GLUCOSE 92 92  ?BUN 31* 18  ?CREATININE 1.19 1.01  ?CALCIUM 10.1 9.6  ?MG  --  2.0  ? ?Lipid Panel: No results for input(s): CHOL, TRIG, HDL, CHOLHDL, VLDL, LDLCALC in the last 168 hours. ?HgbA1c: No results for input(s): HGBA1C in the last 168 hours. ?Urine Drug Screen:  ?Recent Labs  ?Lab 11/06/21 ?2238  ?LABOPIA NONE DETECTED  ?COCAINSCRNUR NONE DETECTED  ?LABBENZ NONE DETECTED  ?AMPHETMU NONE DETECTED  ?THCU NONE DETECTED  ?LABBARB NONE DETECTED  ?  ?Alcohol Level No results for input(s): ETH in the last 168 hours. ? ?IMAGING past 24 hours ?CT Head Wo Contrast ? ?Result Date: 11/06/2021 ?CLINICAL DATA:  Altered mental status possible syncope EXAM: CT HEAD WITHOUT CONTRAST TECHNIQUE: Contiguous axial images were obtained from the base of the skull through the vertex without intravenous contrast. RADIATION DOSE REDUCTION: This exam was performed according to the departmental dose-optimization program which includes automated exposure control, adjustment of the mA and/or kV according to patient size and/or use of iterative  reconstruction technique. COMPARISON:  02/23/2021 FINDINGS: Brain: No evidence of acute infarction, hemorrhage, hydrocephalus, extra-axial collection or mass lesion/mass effect. Periventricular and deep white matter hypodensity. Vascular: No hyperdense vessel or unexpected calcification. Skull: Normal. Negative for fracture or focal lesion. Sinuses/Orbits: No acute finding. Other: None. IMPRESSION: No acute intracranial pathology. Small-vessel white matter disease. Electronically Signed   By: Jearld LeschAlex D Bibbey M.D.   On: 11/06/2021 16:20  ? ?MR BRAIN W WO CONTRAST ? ?Result Date: 11/07/2021 ?CLINICAL DATA:  Seizure EXAM: MRI HEAD WITHOUT AND WITH CONTRAST TECHNIQUE: Multiplanar, multiecho pulse sequences of the brain and surrounding structures were obtained without and with intravenous contrast. CONTRAST:  7mL GADAVIST GADOBUTROL 1 MMOL/ML IV SOLN COMPARISON:  None. FINDINGS: Brain: Small acute infarct within the posterior right parietal lobe. No acute or chronic hemorrhage. There is multifocal hyperintense T2-weighted signal within the white matter. Generalized volume loss without a clear lobar predilection. The midline structures are normal. Vascular: Major flow voids are preserved. Skull and upper cervical spine: Normal calvarium and skull base. Visualized upper cervical spine and soft tissues are normal. Sinuses/Orbits:No paranasal sinus fluid levels or advanced mucosal thickening. No mastoid or middle ear effusion. Normal orbits. IMPRESSION: 1. Small acute infarct within the posterior right parietal lobe. No hemorrhage or mass effect. 2. Findings of chronic microvascular disease and generalized volume loss. Electronically Signed   By: Caryn BeeKevin  Chase Picket M.D.   On: 11/07/2021 03:19  ? ?EEG adult ? ?Result Date: 11/07/2021 ?Charlsie Quest, MD     11/07/2021  8:40 AM Patient Name: Brandon Gray MRN: [id number] Epilepsy Attending: Charlsie Quest Referring Physician/Provider: Caryl Pina, MD Date: 11/07/2021  Duration: 26.48 mins Patient history:  88 year old male with a history of seizures, presenting with a 2-3 week history of multiple stereotyped spells of unresponsiveness with staring. EEG to evaluate for seizure. Level of alertness: Awake, asleep AEDs during EEG study: None Technical aspects: This EEG study was done with scalp electrodes positioned according to the 10-20 International system of electrode placement. Electrical activity was acquired at a sampling rate of 500Hz  and reviewed with a high frequency filter of 70Hz  and a low frequency filter of 1Hz . EEG data were recorded continuously and digitally stored. Description: The posterior dominant rhythm consists of 8-9 Hz activity of moderate voltage (25-35 uV) seen predominantly in posterior head regions, symmetric and reactive to eye opening and eye closing. Sleep was characterized by vertex waves, sleep spindles (12 to 14 Hz), maximal frontocentral region. Hyperventilation and photic stimulation were not performed.   IMPRESSION: This study is within normal limits. No seizures or epileptiform discharges were seen throughout the recording. Priyanka  ? ?Overnight EEG with video ? ?Result Date: 11/07/2021 ? , MD     11/07/2021  8:45 AM Patient Name: Brandon Gray MRN: Charlsie Quest Epilepsy Attending: 11/09/2021 Referring Physician/Provider: Nino Parsley, MD Duration: 11/07/2021 0516 to 0845  Patient history:  88 year old male with a history of seizures, presenting with a 2-3 week history of multiple stereotyped spells of unresponsiveness with staring. EEG to evaluate for seizure.  Level of alertness: Awake, asleep  AEDs during EEG study: None  Technical aspects: This EEG study was done with scalp electrodes positioned according to the 10-20 International system of electrode placement. Electrical activity was acquired at a sampling rate of 500Hz  and reviewed with a high frequency filter of 70Hz  and a low frequency filter of 1Hz . EEG  data were recorded continuously and digitally stored.  Description: The posterior dominant rhythm consists of 8-9 Hz activity of moderate voltage (25-35 uV) seen predominantly in posterior head regions, symmetric and reactive to eye opening and eye closing. Sleep was characterized by vertex waves, sleep spindles (12 to 14 Hz), maximal frontocentral region. Hyperventilation and photic stimulation were not performed.    IMPRESSION: This study is within normal limits. No seizures or epileptiform discharges were seen throughout the recording.  Priyanka Caryl Pina   ? ?PHYSICAL EXAM ? ?Physical Exam  ?Constitutional: Appears well-developed and well-nourished.  ?Cardiovascular: Normal rate and regular rhythm.  ?Respiratory: Effort normal, non-labored breathing ? ?Mental Status: Awake and alert. Severely hard of hearing, but will follow all commands which are repeated to him loudly. Speech is fluent.  ?Attention span and concentration were normal. ? ?Cranial Nerves: ?II: Temporal visual fields intact. PERRL.   ?III,IV, VI: No ptosis. EOMI.  ?V: Temp sensation equal bilaterally ?VII: Smile symmetric ?VIII: Severely hard of hearing ?IX,X: No hoarseness or hypophonia ?XI: Head is midline ?XII: Midline tongue extension ?Motor: ?Right :  Upper extremity   5/5         Left:     Upper extremity   5/5 ?       Lower extremity   5/5             Lower extremity   5/5 ?Sensory: Temp sensation intact x  4.   ?Cerebellar: No ataxia with FNF bilaterally  ?Gait: Deferred ? ?  ? ? ?ASSESSMENT/PLAN ?Mr. Brandon Gray is a 88 y.o. male with history of HLD, hearing loss and seizures, on Lamictal 200 mg BID, who presented for evaluation from nursing home for episodes of unresponsiveness and staring spells where he has no recollection of those events and he loses time. Suspect absent breakthrough seizures. CTA head and neck pending. Lamictal restarted. Patient is currently on ASA 325mg . MRI shows small acute parietal infarct.  ? ?Stroke:   right parietal lobe infarct likely secondary small vessel disease source ?Code Stroke CT head No acute abnormality. Small vessel disease.  ?CTA head & neck  ?MRI  Small acute infarct within the posterior right pa

## 2021-11-07 NOTE — Plan of Care (Addendum)
Neurology Plan of Care:  ? ?Assessment  ?Brandon Gray is an 88 y.o. male with a PMHx of HLD, hearing loss and seizures, on Lamictal 200 mg BID, who presented for evaluation from nursing home for episodes of unresponsiveness and staring spells where he has no recollection of those events and he loses time. The episodes have happened multiple times and can happen anytime during the day. They have occurred over the past 2-3 weeks, but he also had similar events last year, per chart. Patient reported that while reading he would feel like he "spaced out". Facility was concerned for ongoing seizures versus stroke and sent him for evaluation to [place]. He was sent her for LTM  and MRI brain  ? ?cEEG 3/26 [phone]): This study is within normal limits. No seizures or epileptiform discharges were seen throughout the recording. ? ?MRI Aaron Edelman 3/26: ?1. Small acute infarct within the posterior right parietal lobe. No ?hemorrhage or mass effect. ?2. Findings of chronic microvascular disease and generalized volume ?Loss ? ?EXAM: ?Temp:  [97.6 ?F (36.4 ?C)-100.5 ?F (38.1 ?C)] 98 ?F (36.7 ?C) (03/26 0736) ?Pulse Rate:  [61-154] 69 (03/26 0736) ?Resp:  [16-26] 18 (03/26 0736) ?BP: (109-152)/(50-94) 117/70 (03/26 0736) ?SpO2:  [62 %-98 %] 93 % (03/26 0736) ?Weight:  [69.9 kg] 69.9 kg (03/25 1429) ? ?General - Well nourished, well developed, elderly gentleman in no apparent distress. ? ?Ophthalmologic - fundi not visualized due to noncooperation. ? ?Cardiovascular - Regular rhythm and rate. ? ?Mental Status -  ?Level of arousal and orientation to time, place, and person were intact. HOH- reads lips  ?Language including expression, naming, repetition, comprehension was assessed and found intact. ?Attention span and concentration were normal. ?Recent and remote memory were intact. ?Fund of Knowledge was assessed and was intact. ? ?Cranial Nerves II - XII - ?II - Visual field intact OU. ?III, IV, VI - Extraocular  movements intact. ?V - Facial sensation intact bilaterally. ?VII - Facial movement intact bilaterally. ?VIII - HOH  ?X - Palate elevates symmetrically. ?XI - Chin turning & shoulder shrug intact bilaterally. ?XII - Tongue protrusion intact. ? ?Motor Strength - The patient?s strength was normal in all extremities and pronator drift was absent.  Bulk was normal and fasciculations were absent.   ?Motor Tone - Muscle tone was assessed at the neck and appendages and was normal. ? ?Sensory - decreased on left leg ? ?Coordination - The patient had normal movements in the hands and feet with no ataxia or dysmetria.  Tremor was absent. ? ?Gait and Station - deferred.  ? ?NIHSS ?1a Level of Conscious.: 0 ?1b LOC Questions: 0 ?1c LOC Commands: 0 ?2 Best Gaze: 0 ?3 Visual: 0 ?4 Facial Palsy: 0 ?5a Motor Arm - left: 0 ?5b Motor Arm - Right: 0 ?6a Motor Leg - Left: 0 ?6b Motor Leg - Right: 0 ?7 Limb Ataxia: 0 ?8 Sensory: 1 ?9 Best Language: 0 ?10 Dysarthria: 0 ?11 Extinct. and Inatten.: 0 ?TOTAL: 1 ?  ?Recommendations: ?- will continue LTM since only been on a few hours  ?- HgbA1c, fasting lipid panel ?- MRI of the brain without contrast ?- Frequent neuro checks ?- Echocardiogram ?- CTA head and neck ?- Prophylactic therapy-Antiplatelet med: Aspirin - dose 81mg   ?- Risk factor modification ?- Telemetry monitoring ?- PT consult, OT consult, Speech consult ?- Stroke team to follow  ? ?Beulah Gandy DNP, [id number]-AG  ?

## 2021-11-07 NOTE — Progress Notes (Signed)
Maint. Complete. Skin check performed. No break down under GRND, PZ, T5 ?

## 2021-11-07 NOTE — Progress Notes (Signed)
?PROGRESS NOTE ? ? ? ?Brandon Gray  ZOX:[id number] DOB: 11/05/1927 DOA: Gray ?PCP: Brandon Gray, Lee, Brandon Gray  ?Chief Complaint  ?Patient presents with  ? Loss of Consciousness  ? ? ?Brief Narrative:  ?Brandon Gray is Brandon Gray 88 y.o. male with medical history significant for seizures, hyperlipidemia who is admitted to [facility] on [facility] by way of transfer from [facility] with concern for breakthrough of absence seizures after presenting from SNF to the latter facility complaining of episodes of diminished responsiveness.  ? ? ?Assessment & Plan: ?  ?Principal Problem: ?  Seizure (Brandon Gray) ?Active Problems: ?  Stroke [facility](Brandon Gray) ?  Hyperlipidemia ? ? ?Assessment and Plan: ?* Seizure (Brandon Gray) ?Recurrent episodes of diminished responsiveness, staring off, 10 episodes over last 3 weeks ?On lamictal at home ?Concern for breakthrough absence seizures ?Neurology recommended LTM EEG and MRI ?MRI showed acute infarct within posterior right parietal lobe ?Seizure precautions ?UA not c/w UTI, negative rapid urine drug screen ?  ? ?Stroke [facility](Brandon Gray) ?Appreciate neurology assistance, suspects likely 2/2 small vessel disease ?MRI with acute infarct within posterior right parietal lobe ?Neurology recs ?CTA head/neck pending ?Echo pending ?PT/OT/SLP ?LDL and A1c pending ?Asa 325 mg daily  ? ?Hyperlipidemia ?LDL pending ?On simvastatin ?  ? ? ?DVT prophylaxis: lovenox ?Code Status: full ?Family Communication: none ?Disposition:  ? ?Status is: Observation ?The patient will require care spanning > 2 midnights and should be moved to inpatient because: continued EEG monitoring, neurology recs, stroke w/u ?  ?Consultants:  ?neurology ? ?Procedures:  ?EEG ? ?Antimicrobials:  ?Anti-infectives (From admission, onward)  ? ? None  ? ?  ? ? ?Subjective: ?No complaints ? ?Objective: ?Vitals:  ? 11/06/21 2327 11/07/21 0352 11/07/21 0736 11/07/21 1207  ?BP: (!) 119/50 (!) 116/54 117/70 (!) 133/54  ?Pulse: 64 61 69 70  ?Resp: 20  20 18 18   ?Temp: 97.7 ?F (36.5 ?C) 97.7 ?F (36.5 ?C) 98 ?F (36.7 ?C) 97.8 ?F (36.6 ?C)  ?TempSrc: Oral Axillary Oral Oral  ?SpO2: 94% 94% 93% 93%  ?Weight:      ?Height:      ? ? ?Intake/Output Summary (Last 24 hours) at 11/07/2021 1407 ?Last data filed at 11/07/2021 1239 ?Gross per 24 hour  ?Intake 120 ml  ?Output 1300 ml  ?Net -1180 ml  ? ?Filed Weights  ? 11/06/21 1429  ?Weight: 69.9 kg  ? ? ?Examination: ? ?General exam: Appears calm and comfortable  ?Respiratory system: unlabored ?Cardiovascular system: RRR ?Gastrointestinal system: Abdomen is nondistended, soft and nontender. ?Central nervous system: Alert and oriented. No focal neurological deficits. CN 2-12 intact, symmetric strength, sensation intact to light touch. ?Extremities: no Brandon Gray ?Skin: No rashes, lesions or ulcers ?Psychiatry: Judgement and insight appear normal. Mood & affect appropriate.  ? ? ? ?Data Reviewed: I have personally reviewed following labs and imaging studies ? ?CBC: ?Recent Labs  ?Lab 11/06/21 ?1554 11/07/21 ?[id number]  ?WBC 7.1 5.8  ?NEUTROABS 4.2 3.4  ?HGB 12.9* 12.8*  ?HCT 39.6 39.1  ?MCV 91.0 91.1  ?PLT 244 227  ? ? ?Basic Metabolic Panel: ?Recent Labs  ?Lab 11/06/21 ?1554 11/07/21 ?[id number]  ?NA 138 139  ?K 4.2 4.1  ?CL 101 106  ?CO2 27 26  ?GLUCOSE 92 92  ?BUN 31* 18  ?CREATININE 1.19 1.01  ?CALCIUM 10.1 9.6  ?MG  --  2.0  ? ? ?GFR: ?Estimated Creatinine Clearance: 39.9 mL/min (by C-G formula based on SCr of 1.01 mg/dL). ? ?Liver Function Tests: ?Recent Labs  ?Lab 11/07/21 ?[id number]  ?  AST 19  ?ALT 15  ?ALKPHOS 64  ?BILITOT 0.8  ?PROT 5.8*  ?ALBUMIN 3.8  ? ? ?CBG: ?Recent Labs  ?Lab 11/06/21 ?1432  ?GLUCAP 90  ? ? ? ?No results found for this or any previous visit (from the past 240 hour(s)).  ? ? ? ? ? ?Radiology Studies: ?CT Head Wo Contrast ? ?Result Date: Gray ?CLINICAL DATA:  Altered mental status possible syncope EXAM: CT HEAD WITHOUT CONTRAST TECHNIQUE: Contiguous axial images were obtained from the base of the skull through the  vertex without intravenous contrast. RADIATION DOSE REDUCTION: This exam was performed according to the departmental dose-optimization program which includes automated exposure control, adjustment of the mA and/or kV according to patient size and/or use of iterative reconstruction technique. COMPARISON:  02/23/2021 FINDINGS: Brain: No evidence of acute infarction, hemorrhage, hydrocephalus, extra-axial collection or mass lesion/mass effect. Periventricular and deep white matter hypodensity. Vascular: No hyperdense vessel or unexpected calcification. Skull: Normal. Negative for fracture or focal lesion. Sinuses/Orbits: No acute finding. Other: None. IMPRESSION: No acute intracranial pathology. Small-vessel white matter disease. Electronically Signed   By: Brandon Gray.D.   On: 11/06/2021 16:20  ? ?MR BRAIN W WO CONTRAST ? ?Result Date: 11/07/2021 ?CLINICAL DATA:  Seizure EXAM: MRI HEAD WITHOUT AND WITH CONTRAST TECHNIQUE: Multiplanar, multiecho pulse sequences of the brain and surrounding structures were obtained without and with intravenous contrast. CONTRAST:  4mL GADAVIST GADOBUTROL 1 MMOL/ML IV SOLN COMPARISON:  None. FINDINGS: Brain: Small acute infarct within the posterior right parietal lobe. No acute or chronic hemorrhage. There is multifocal hyperintense T2-weighted signal within the white matter. Generalized volume loss without Meda Dudzinski clear lobar predilection. The midline structures are normal. Vascular: Major flow voids are preserved. Skull and upper cervical spine: Normal calvarium and skull base. Visualized upper cervical spine and soft tissues are normal. Sinuses/Orbits:No paranasal sinus fluid levels or advanced mucosal thickening. No mastoid or middle ear effusion. Normal orbits. IMPRESSION: 1. Small acute infarct within the posterior right parietal lobe. No hemorrhage or mass effect. 2. Findings of chronic microvascular disease and generalized volume loss. Electronically Signed   By: Deatra Robinson Gray.D.    On: 11/07/2021 03:19  ? ?EEG adult ? ?Result Date: 11/07/2021 ?Charlsie Quest, MD     11/07/2021  8:40 AM Patient Name: Sharod Petsch MRN: [id number] Epilepsy Attending: Charlsie Quest Referring Physician/Provider: Caryl Pina, MD Date: 11/07/2021 Duration: 26.48 mins Patient history:  88 year old male with Keyton Bhat history of seizures, presenting with Aramis Weil 2-3 week history of multiple stereotyped spells of unresponsiveness with staring. EEG to evaluate for seizure. Level of alertness: Awake, asleep AEDs during EEG study: None Technical aspects: This EEG study was done with scalp electrodes positioned according to the 10-20 International system of electrode placement. Electrical activity was acquired at Santos Sollenberger sampling rate of 500Hz  and reviewed with Audry Pecina high frequency filter of 70Hz  and Breiona Couvillon low frequency filter of 1Hz . EEG data were recorded continuously and digitally stored. Description: The posterior dominant rhythm consists of 8-9 Hz activity of moderate voltage (25-35 uV) seen predominantly in posterior head regions, symmetric and reactive to eye opening and eye closing. Sleep was characterized by vertex waves, sleep spindles (12 to 14 Hz), maximal frontocentral region. Hyperventilation and photic stimulation were not performed.   IMPRESSION: This study is within normal limits. No seizures or epileptiform discharges were seen throughout the recording. Priyanka  ? ?Overnight EEG with video ? ?Result Date: 11/07/2021 ? , MD  11/07/2021  8:45 AM Patient Name: Julion Gatt MRN: [id number] Epilepsy Attending: Charlsie Quest Referring Physician/Provider: Caryl Pina, MD Duration: 11/07/2021 0516 to 0845  Patient history:  88 year old male with Quinnlyn Hearns history of seizures, presenting with Jered Heiny 2-3 week history of multiple stereotyped spells of unresponsiveness with staring. EEG to evaluate for seizure.  Level of alertness: Awake, asleep  AEDs during EEG study: None  Technical aspects: This EEG study  was done with scalp electrodes positioned according to the 10-20 International system of electrode placement. Electrical activity was acquired at Nealy Hickmon sampling rate of 500Hz  and reviewed with Betty Daidone high frequency

## 2021-11-07 NOTE — Progress Notes (Signed)
Stat  EEG complete - results pending.  

## 2021-11-07 NOTE — Procedures (Signed)
Patient Name: Marquett Bertoli  ?MRN: [id number]  ?Epilepsy Attending: Charlsie Quest  ?Referring Physician/Provider: Caryl Pina, MD ?Date: 11/07/2021 ?Duration: 26.48 mins ? ?Patient history:  88 year old male with a history of seizures, presenting with a 2-3 week history of multiple stereotyped spells of unresponsiveness with staring. EEG to evaluate for seizure. ? ?Level of alertness: Awake, asleep ? ?AEDs during EEG study: None ? ?Technical aspects: This EEG study was done with scalp electrodes positioned according to the 10-20 International system of electrode placement. Electrical activity was acquired at a sampling rate of 500Hz  and reviewed with a high frequency filter of 70Hz  and a low frequency filter of 1Hz . EEG data were recorded continuously and digitally stored.  ? ?Description: The posterior dominant rhythm consists of 8-9 Hz activity of moderate voltage (25-35 uV) seen predominantly in posterior head regions, symmetric and reactive to eye opening and eye closing. Sleep was characterized by vertex waves, sleep spindles (12 to 14 Hz), maximal frontocentral region. Hyperventilation and photic stimulation were not performed.    ? ?IMPRESSION: ?This study is within normal limits. No seizures or epileptiform discharges were seen throughout the recording. ? ?  ? ?

## 2021-11-07 NOTE — Hospital Course (Addendum)
Brandon Gray is [name] 88 y.o. male with medical history significant for seizures, hyperlipidemia who is admitted to [facility] on 11/06/2021 by way of transfer from [facility] emergency department with concern for breakthrough of absence seizures after presenting from SNF to the latter facility complaining of episodes of diminished responsiveness.  He had MRI with small acute infarct within posterior right parietal lobe.  EEG was negative.  Seen by neurology who recommended DAPT x21 days, then aspirin alone, simvastatin increased, loop recorder placed prior to discharge.  No changes to AED meds recommended. ? ?See below for additional details ?

## 2021-11-07 NOTE — Procedures (Addendum)
Patient Name: Brandon Gray  ?MRN: [id number]  ?Epilepsy Attending: Charlsie Quest  ?Referring Physician/Provider: Caryl Pina, MD ?Duration: 11/07/2021 0623 to 11/08/2021 7628 ?  ?Patient history:  88 year old male with a history of seizures, presenting with a 2-3 week history of multiple stereotyped spells of unresponsiveness with staring. EEG to evaluate for seizure. ?  ?Level of alertness: Awake, asleep ?  ?AEDs during EEG study: None ?  ?Technical aspects: This EEG study was done with scalp electrodes positioned according to the 10-20 International system of electrode placement. Electrical activity was acquired at a sampling rate of 500Hz  and reviewed with a high frequency filter of 70Hz  and a low frequency filter of 1Hz . EEG data were recorded continuously and digitally stored.  ?  ?Description: The posterior dominant rhythm consists of 8-9 Hz activity of moderate voltage (25-35 uV) seen predominantly in posterior head regions, symmetric and reactive to eye opening and eye closing. Sleep was characterized by vertex waves, sleep spindles (12 to 14 Hz), maximal frontocentral region. Hyperventilation and photic stimulation were not performed.    ?  ?IMPRESSION: ?This study is within normal limits. No seizures or epileptiform discharges were seen throughout the recording. ?  ?  ?

## 2021-11-07 NOTE — Care Management Obs Status (Signed)
MEDICARE OBSERVATION STATUS NOTIFICATION ? ? ?Patient Details  ?Name: Brandon Gray ?MRN: [id number] ?Date of Birth: 1928-02-22 ? ? ?Medicare Observation Status Notification Given:  Yes ? ? ? ?Lawerance Sabal, RN ?11/07/2021, 1:51 PM ?

## 2021-11-07 NOTE — Progress Notes (Signed)
Patient isn't available for EEG. EEG to be placed after patient has completed MR  ?

## 2021-11-07 NOTE — Plan of Care (Signed)
Pt is alert oriented x 4. Pt is very hard of hearing, even with hearing aids pt has difficulty. Pt denies pain. No seizure activity noted. Seizure precautions. Pt is in MRI.  ? ?Problem: Education: ?Goal: Knowledge of General Education information will improve ?Description: Including pain rating scale, medication(s)/side effects and non-pharmacologic comfort measures ?Outcome: Progressing ?  ?Problem: Health Behavior/Discharge Planning: ?Goal: Ability to manage health-related needs will improve ?Outcome: Progressing ?  ?Problem: Clinical Measurements: ?Goal: Ability to maintain clinical measurements within normal limits will improve ?Outcome: Progressing ?Goal: Will remain free from infection ?Outcome: Progressing ?Goal: Diagnostic test results will improve ?Outcome: Progressing ?Goal: Respiratory complications will improve ?Outcome: Progressing ?Goal: Cardiovascular complication will be avoided ?Outcome: Progressing ?  ?Problem: Activity: ?Goal: Risk for activity intolerance will decrease ?Outcome: Progressing ?  ?Problem: Nutrition: ?Goal: Adequate nutrition will be maintained ?Outcome: Progressing ?  ?Problem: Coping: ?Goal: Level of anxiety will decrease ?Outcome: Progressing ?  ?Problem: Elimination: ?Goal: Will not experience complications related to bowel motility ?Outcome: Progressing ?Goal: Will not experience complications related to urinary retention ?Outcome: Progressing ?  ?Problem: Pain Managment: ?Goal: General experience of comfort will improve ?Outcome: Progressing ?  ?Problem: Safety: ?Goal: Ability to remain free from injury will improve ?Outcome: Progressing ?  ?Problem: Skin Integrity: ?Goal: Risk for impaired skin integrity will decrease ?Outcome: Progressing ?  ?Problem: Education: ?Goal: Expressions of having a comfortable level of knowledge regarding the disease process will increase ?Outcome: Progressing ?  ?Problem: Coping: ?Goal: Ability to adjust to condition or change in health will  improve ?Outcome: Progressing ?Goal: Ability to identify appropriate support needs will improve ?Outcome: Progressing ?  ?Problem: Health Behavior/Discharge Planning: ?Goal: Compliance with prescribed medication regimen will improve ?Outcome: Progressing ?  ?Problem: Medication: ?Goal: Risk for medication side effects will decrease ?Outcome: Progressing ?  ?Problem: Clinical Measurements: ?Goal: Complications related to the disease process, condition or treatment will be avoided or minimized ?Outcome: Progressing ?Goal: Diagnostic test results will improve ?Outcome: Progressing ?  ?Problem: Safety: ?Goal: Verbalization of understanding the information provided will improve ?Outcome: Progressing ?  ?

## 2021-11-08 ENCOUNTER — Observation Stay (HOSPITAL_BASED_OUTPATIENT_CLINIC_OR_DEPARTMENT_OTHER): Payer: Medicare Other

## 2021-11-08 ENCOUNTER — Encounter (HOSPITAL_COMMUNITY)
Admission: EM | Disposition: A | Discharge status: Home Health | Payer: Self-pay | Source: Home / Self Care | Attending: Emergency Medicine

## 2021-11-08 DIAGNOSIS — I6389 Other cerebral infarction: Secondary | ICD-10-CM

## 2021-11-08 DIAGNOSIS — R4189 Other symptoms and signs involving cognitive functions and awareness: Secondary | ICD-10-CM | POA: Diagnosis not present

## 2021-11-08 DIAGNOSIS — I6529 Occlusion and stenosis of unspecified carotid artery: Secondary | ICD-10-CM

## 2021-11-08 DIAGNOSIS — R569 Unspecified convulsions: Secondary | ICD-10-CM | POA: Diagnosis not present

## 2021-11-08 DIAGNOSIS — Z8669 Personal history of other diseases of the nervous system and sense organs: Secondary | ICD-10-CM | POA: Diagnosis not present

## 2021-11-08 DIAGNOSIS — I639 Cerebral infarction, unspecified: Secondary | ICD-10-CM

## 2021-11-08 HISTORY — PX: LOOP RECORDER INSERTION: EP1214

## 2021-11-08 LAB — CBC WITH DIFFERENTIAL/PLATELET
Abs Immature Granulocytes: 0.02 10*3/uL (ref 0.00–0.07)
Basophils Absolute: 0 10*3/uL (ref 0.0–0.1)
Basophils Relative: 0 %
Eosinophils Absolute: 0.1 10*3/uL (ref 0.0–0.5)
Eosinophils Relative: 1 %
HCT: 38.9 % — ABNORMAL LOW (ref 39.0–52.0)
Hemoglobin: 13.2 g/dL (ref 13.0–17.0)
Immature Granulocytes: 0 %
Lymphocytes Relative: 28 %
Lymphs Abs: 2.1 10*3/uL (ref 0.7–4.0)
MCH: 30.5 pg (ref 26.0–34.0)
MCHC: 33.9 g/dL (ref 30.0–36.0)
MCV: 89.8 fL (ref 80.0–100.0)
Monocytes Absolute: 0.7 10*3/uL (ref 0.1–1.0)
Monocytes Relative: 9 %
Neutro Abs: 4.8 10*3/uL (ref 1.7–7.7)
Neutrophils Relative %: 62 %
Platelets: 232 10*3/uL (ref 150–400)
RBC: 4.33 MIL/uL (ref 4.22–5.81)
RDW: 14 % (ref 11.5–15.5)
WBC: 7.7 10*3/uL (ref 4.0–10.5)
nRBC: 0 % (ref 0.0–0.2)

## 2021-11-08 LAB — ECHOCARDIOGRAM COMPLETE
AR max vel: 3.21 cm2
AV Area VTI: 3.68 cm2
AV Area mean vel: 3.14 cm2
AV Mean grad: 5.3 mmHg
AV Peak grad: 10 mmHg
Ao pk vel: 1.58 m/s
Calc EF: 41.4 %
Height: 64 in
S' Lateral: 2.9 cm
Single Plane A2C EF: 42.1 %
Single Plane A4C EF: 41.5 %
Weight: 2464 oz

## 2021-11-08 LAB — LIPID PANEL
Cholesterol: 152 mg/dL (ref 0–200)
HDL: 58 mg/dL (ref >40)
LDL Cholesterol: 83 mg/dL (ref 0–99)
Total CHOL/HDL Ratio: 2.6 RATIO
Triglycerides: 56 mg/dL (ref <150)
VLDL: 11 mg/dL (ref 0–40)

## 2021-11-08 LAB — COMPREHENSIVE METABOLIC PANEL
ALT: 16 U/L (ref 0–44)
AST: 16 U/L (ref 15–41)
Albumin: 3.8 g/dL (ref 3.5–5.0)
Alkaline Phosphatase: 60 U/L (ref 38–126)
Anion gap: 7 (ref 5–15)
BUN: 17 mg/dL (ref 8–23)
CO2: 25 mmol/L (ref 22–32)
Calcium: 9.6 mg/dL (ref 8.9–10.3)
Chloride: 107 mmol/L (ref 98–111)
Creatinine, Ser: 1.16 mg/dL (ref 0.61–1.24)
GFR, Estimated: 59 mL/min — ABNORMAL LOW (ref >60)
Glucose, Bld: 91 mg/dL (ref 70–99)
Potassium: 4.1 mmol/L (ref 3.5–5.1)
Sodium: 139 mmol/L (ref 135–145)
Total Bilirubin: 0.8 mg/dL (ref 0.3–1.2)
Total Protein: 5.9 g/dL — ABNORMAL LOW (ref 6.5–8.1)

## 2021-11-08 LAB — HEMOGLOBIN A1C
Hgb A1c MFr Bld: 5.6 % (ref 4.8–5.6)
Mean Plasma Glucose: 114.02 mg/dL

## 2021-11-08 LAB — PHOSPHORUS: Phosphorus: 3.8 mg/dL (ref 2.5–4.6)

## 2021-11-08 LAB — MAGNESIUM: Magnesium: 1.9 mg/dL (ref 1.7–2.4)

## 2021-11-08 SURGERY — LOOP RECORDER INSERTION
Anesthesia: LOCAL

## 2021-11-08 MED ORDER — ASPIRIN EC 81 MG PO TBEC
81.0000 mg | DELAYED_RELEASE_TABLET | Freq: Every day | ORAL | Status: DC
  Administered 2021-11-09: 81 mg via ORAL
  Filled 2021-11-08: qty 1

## 2021-11-08 MED ORDER — LIDOCAINE-EPINEPHRINE 1 %-1:100000 IJ SOLN
INTRAMUSCULAR | Status: AC
  Filled 2021-11-08: qty 1

## 2021-11-08 MED ORDER — CLOPIDOGREL BISULFATE 75 MG PO TABS
75.0000 mg | ORAL_TABLET | Freq: Every day | ORAL | Status: DC
  Administered 2021-11-08 – 2021-11-09 (×2): 75 mg via ORAL
  Filled 2021-11-08 (×2): qty 1

## 2021-11-08 MED ORDER — LIDOCAINE-EPINEPHRINE 1 %-1:100000 IJ SOLN
INTRAMUSCULAR | Status: DC | PRN
  Administered 2021-11-08: 10 mL

## 2021-11-08 MED ORDER — SIMVASTATIN 20 MG PO TABS
40.0000 mg | ORAL_TABLET | Freq: Every day | ORAL | Status: DC
  Administered 2021-11-08 – 2021-11-09 (×2): 40 mg via ORAL
  Filled 2021-11-08 (×2): qty 2

## 2021-11-08 SURGICAL SUPPLY — 2 items
MONITOR CARDIAC LUX-DX INSERT (Prosthesis & Implant Heart) ×1 IMPLANT
PACK LOOP INSERTION (CUSTOM PROCEDURE TRAY) ×2 IMPLANT

## 2021-11-08 NOTE — Consult Note (Signed)
? ? ?ELECTROPHYSIOLOGY CONSULT NOTE  ?Patient ID: Brandon Gray ?MRN: [id number], DOB/AGE: Mar 16, 1928  ? ?Admit date: 11/06/2021 ?Date of Consult: 11/08/2021 ? ?Primary Physician: Earmon Phoenix, NP ?Primary Cardiologist: None  ?Primary Electrophysiologist: New to Dr. Quentin Ore ?Reason for Consultation: Cryptogenic stroke; recommendations regarding Implantable Loop Recorder ?Insurance: BCBS/Medicare ? ?History of Present Illness ?EP has been asked to evaluate Brandon Gray for placement of an implantable loop recorder to monitor for atrial fibrillation by Dr Leonie Man.  The patient was admitted on 11/06/2021 with unresponsive episodes.   ? ?Imaging demonstrated R parietal lobe infarct likely secondary cryptogenic embolic source.   ? ?He has undergone workup for stroke:  ?right parietal lobe infarct likely secondary cryptogenic embolic source  ?code Stroke CT head No acute abnormality. Small vessel disease.  ?CTA head & neck  ?MRI  Small acute infarct within the posterior right parietal lobe. ?2D Echo pending ?EEG- No seizures or epileptiform discharges were seen throughout the recording. ?LDL 93 ?HgbA1c 5.6 ?VTE prophylaxis - Lovenox ? ?EEG without stroke activity. ? ?The patient has been monitored on telemetry which has demonstrated sinus rhythm with no arrhythmias.  Inpatient stroke work-up will not require a TEE per Neurology.  ? ?Echocardiogram as above. Lab work is reviewed. ? ?Prior to admission, the patient denies chest pain, shortness of breath, dizziness, palpitations, or syncope.  He is recovering from his stroke with plans to return to ALF  at discharge. ? ?Past Medical History:  ?Diagnosis Date  ? Hearing loss   ? Hyperlipidemia   ? Seizures ([place])   ?  ? ?Surgical History: History reviewed. No pertinent surgical history.  ? ?Medications Prior to Admission  ?Medication Sig Dispense Refill Last Dose  ? lamoTRIgine (LAMICTAL) 200 MG tablet Take 1 tablet (200 mg total) by mouth 2 (two) times daily. 180 tablet  3 11/07/2021  ? melatonin 5 MG TABS Take 5 mg by mouth at bedtime.   11/06/2021  ? simvastatin (ZOCOR) 20 MG tablet Take 1 tablet (20 mg total) by mouth daily. 90 tablet 3 11/06/2021  ? ? ?Inpatient Medications:  ? aspirin EC  81 mg Oral Daily  ? clopidogrel  75 mg Oral Daily  ? enoxaparin (LOVENOX) injection  40 mg Subcutaneous Q24H  ? lamoTRIgine  200 mg Oral BID  ? polyethylene glycol  17 g Oral Daily  ? simvastatin  40 mg Oral Daily  ? ? ?Allergies: No Known Allergies ? ?Social History  ? ?Socioeconomic History  ? Marital status: Widowed  ?  Spouse name: Not on file  ? Number of children: Not on file  ? Years of education: Not on file  ? Highest education level: Not on file  ?Occupational History  ? Not on file  ?Tobacco Use  ? Smoking status: Never  ? Smokeless tobacco: Never  ?Vaping Use  ? Vaping Use: Never used  ?Substance and Sexual Activity  ? Alcohol use: Yes  ?  Alcohol/week: 7.0 standard drinks  ?  Types: 7 Glasses of wine per week  ? Drug use: Never  ? Sexual activity: Not Currently  ?Other Topics Concern  ? Not on file  ?Social History Narrative  ? Not on file  ? ?Social Determinants of Health  ? ?Financial Resource Strain: Low Risk   ? Difficulty of Paying Living Expenses: Not hard at all  ?Food Insecurity: No Food Insecurity  ? Worried About Charity fundraiser in the Last Year: Never true  ? Ran Out of Food in the Last Year:  Never true  ?Transportation Needs: No Transportation Needs  ? Lack of Transportation (Medical): No  ? Lack of Transportation (Non-Medical): No  ?Physical Activity: Inactive  ? Days of Exercise per Week: 0 days  ? Minutes of Exercise per Session: 0 min  ?Stress: No Stress Concern Present  ? Feeling of Stress : Not at all  ?Social Connections: Socially Isolated  ? Frequency of Communication with Friends and Family: More than three times a week  ? Frequency of Social Gatherings with Friends and Family: Once a week  ? Attends Religious Services: Never  ? Active Member of Clubs or  Organizations: No  ? Attends Archivist Meetings: Never  ? Marital Status: Widowed  ?Intimate Partner Violence: Not At Risk  ? Fear of Current or Ex-Partner: No  ? Emotionally Abused: No  ? Physically Abused: No  ? Sexually Abused: No  ?  ? ?Family History  ?Problem Relation Age of Onset  ? Diabetes Father   ? Cancer Daughter   ? Early death Daughter   ?  ? ? ?Review of Systems: ?All other systems reviewed and are otherwise negative except as noted above. ? ?Physical Exam: ?Vitals:  ? 11/07/21 2327 11/08/21 0406 11/08/21 0858 11/08/21 1231  ?BP: 113/67 115/66 (!) 124/58 109/64  ?Pulse: 71 67 73 69  ?Resp: 19 18 16 13   ?Temp: 98 ?F (36.7 ?C) 98 ?F (36.7 ?C) 97.9 ?F (36.6 ?C) 98.2 ?F (36.8 ?C)  ?TempSrc: Oral  Oral Oral  ?SpO2: 94% 96% 97% 95%  ?Weight:      ?Height:      ? ? ?GEN- The patient is elderly appearing, alert and oriented x 3 today.   ?Head- normocephalic, atraumatic ?Eyes-  Sclera clear, conjunctiva pink ?Ears- hearing intact ?Oropharynx- clear ?Neck- supple ?Lungs- Clear to ausculation bilaterally, normal work of breathing ?Heart- Regular rate and rhythm, no murmurs, rubs or gallops  ?GI- soft, NT, ND, + BS ?Extremities- no clubbing, cyanosis, or edema ?MS- no significant deformity or atrophy ?Skin- no rash or lesion ?Psych- euthymic mood, full affect ?Neuro- Moves all 4 ? ? ?Labs: ?  ?Lab Results  ?Component Value Date  ? WBC 7.7 11/08/2021  ? HGB 13.2 11/08/2021  ? HCT 38.9 (L) 11/08/2021  ? MCV 89.8 11/08/2021  ? PLT 232 11/08/2021  ?  ?Recent Labs  ?Lab 11/08/21 ?0446  ?NA 139  ?K 4.1  ?CL 107  ?CO2 25  ?BUN 17  ?CREATININE 1.16  ?CALCIUM 9.6  ?PROT 5.9*  ?BILITOT 0.8  ?ALKPHOS 60  ?ALT 16  ?AST 16  ?GLUCOSE 91  ? ?  ?Radiology/Studies: CT ANGIO HEAD NECK W WO CM ? ?Result Date: 11/07/2021 ?CLINICAL DATA:  Follow-up stroke EXAM: CT ANGIOGRAPHY HEAD AND NECK TECHNIQUE: Multidetector CT imaging of the head and neck was performed using the standard protocol during bolus administration of  intravenous contrast. Multiplanar CT image reconstructions and MIPs were obtained to evaluate the vascular anatomy. Carotid stenosis measurements (when applicable) are obtained utilizing NASCET criteria, using the distal internal carotid diameter as the denominator. RADIATION DOSE REDUCTION: This exam was performed according to the departmental dose-optimization program which includes automated exposure control, adjustment of the mA and/or kV according to patient size and/or use of iterative reconstruction technique. CONTRAST:  168mL OMNIPAQUE IOHEXOL 350 MG/ML SOLN COMPARISON:  Noncontrast CT head dated 1 day prior, same-day brain MRI FINDINGS: CT HEAD FINDINGS Brain: The known small infarcts in the right parietal lobe are not well seen on the current  study. There is no evidence of evolved territorial infarct. There is no evidence of acute intracranial hemorrhage or extra-axial fluid collection. There is global parenchymal volume loss with prominence of the ventricular system and extra-axial CSF spaces. The ventricles are stable in size. Patchy hypodensity in the subcortical and periventricular white matter likely reflects sequela of chronic white matter microangiopathy. There is no mass lesion.  There is no midline shift. Vascular: See below. Skull: Normal. Negative for fracture or focal lesion. Sinuses: The paranasal sinuses are clear. Orbits: A left lens implant is noted. The globes and orbits are unremarkable. Review of the MIP images confirms the above findings CTA NECK FINDINGS Aortic arch: The imaged aortic arch is unremarkable. Origin of the brachiocephalic artery is not included within the field of view. The origins of the left common carotid and subclavian arteries are patent. The subclavian arteries are patent to the level imaged. Right carotid system: The right common carotid artery is patent. There is mixed plaque in the proximal right internal carotid artery resulting in up to approximally 30-40%  stenosis. The distal right internal carotid artery is patent. There is severe stenosis at the origin of the right external carotid artery with patent distal branches. There is no evidence of dissection or aneurysm.

## 2021-11-08 NOTE — Progress Notes (Signed)
Patients EEG was D/C'd. Patient hd minor skin breakdown and had minimal breakdown under F4, FZ and Fp2. [facility] was notified of D/C. ?

## 2021-11-08 NOTE — Procedures (Signed)
Patient Name: Brandon Gray  ?MRN: [id number]  ?Epilepsy Attending: Charlsie Quest  ?Referring Physician/Provider: Caryl Pina, MD ?Duration: 11/08/2021 0516 to 11/08/2021 1344 ?  ?Patient history:  88 year old male with a history of seizures, presenting with a 2-3 week history of multiple stereotyped spells of unresponsiveness with staring. EEG to evaluate for seizure. ?  ?Level of alertness: Awake, asleep ?  ?AEDs during EEG study: None ?  ?Technical aspects: This EEG study was done with scalp electrodes positioned according to the 10-20 International system of electrode placement. Electrical activity was acquired at a sampling rate of 500Hz  and reviewed with a high frequency filter of 70Hz  and a low frequency filter of 1Hz . EEG data were recorded continuously and digitally stored.  ?  ?Description: The posterior dominant rhythm consists of 8-9 Hz activity of moderate voltage (25-35 uV) seen predominantly in posterior head regions, symmetric and reactive to eye opening and eye closing. Sleep was characterized by vertex waves, sleep spindles (12 to 14 Hz), maximal frontocentral region. Hyperventilation and photic stimulation were not performed.    ?  ?IMPRESSION: ?This study is within normal limits. No seizures or epileptiform discharges were seen throughout the recording. ?  ?  ?

## 2021-11-08 NOTE — Evaluation (Signed)
Occupational Therapy Evaluation ?Patient Details ?Name: Brandon Gray ?MRN: [id number] ?DOB: 03/19/1928 ?Today's Date: 11/08/2021 ? ? ?History of Present Illness Rhylin Noska is a 88 y.o. male who is admitted to [facility] on 11/06/2021 by way of transfer from [facility] emergency department with concern for breakthrough of absence seizures after presenting from SNF to the latter facility complaining of episodes of diminished responsiveness. 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  PMH: seizures, hyperlipidemia, bilat hearing aides  ? ?Clinical Impression ?  ?Pt is making steady progress with OT at this time.  He overall presents with the need for min guard assist with transfers using his rollator as well as min guard for selfcare tasks sit to stand and grooming at the sink.  Feel he will benefit from acute care OT to help increase ADL independence for return back to his ALF.    ?   ? ?Recommendations for follow up therapy are one component of a multi-disciplinary discharge planning process, led by the attending physician.  Recommendations may be updated based on patient status, additional functional criteria and insurance authorization.  ? ?Follow Up Recommendations ? Other (comment) (Follow-up OT at ALF)  ?  ?Assistance Recommended at Discharge Intermittent Supervision/Assistance  ?Patient can return home with the following A little help with bathing/dressing/bathroom;Assistance with cooking/housework;Direct supervision/assist for medications management ? ?  ?Functional Status Assessment ? Patient has had a recent decline in their functional status and demonstrates the ability to make significant improvements in function in a reasonable and predictable amount of time.  ?Equipment Recommendations ? None recommended by OT  ?  ?   ?Precautions / Restrictions Precautions ?Precautions: Fall ?Precaution Comments: extremely HOH, continuos EEG ?Restrictions ?Weight Bearing Restrictions: No  ? ?   ? ?Mobility Bed Mobility ?  ?  ?  ?  ?  ?  ?  ?  ?  ? ?Transfers ?Overall transfer level: Needs assistance ?Equipment used: Rollator (4 wheels) ?Transfers: Sit to/from Stand, Bed to chair/wheelchair/BSC ?Sit to Stand: Min guard ?  ?  ?Step pivot transfers: Min guard ?  ?  ?General transfer comment: Mod instructional cueing to keep the rollator in front of him and to lock the brakes before sitting down. ?  ? ?  ?Balance Overall balance assessment: Needs assistance ?Sitting-balance support: No upper extremity supported, Feet supported ?Sitting balance-Leahy Scale: Good ?  ?  ?Standing balance support: During functional activity, Single extremity supported ?Standing balance-Leahy Scale: Fair ?  ?  ?  ?  ?  ?  ?  ?  ?  ?  ?  ?  ?   ? ?ADL either performed or assessed with clinical judgement  ? ?ADL Overall ADL's : Needs assistance/impaired ?Eating/Feeding: Set up ?  ?Grooming: Dance movement psychotherapist;Wash/dry hands;Min guard ?  ?Upper Body Bathing: Supervision/ safety ?  ?Lower Body Bathing: Min guard;Sit to/from stand ?  ?Upper Body Dressing : Supervision/safety;Sitting ?  ?Lower Body Dressing: Min guard;Sit to/from stand ?Lower Body Dressing Details (indicate cue type and reason): donning gripper socks ?Toilet Transfer: Min guard ?  ?Toileting- Water quality scientist and Hygiene: Min guard ?  ?  ?  ?Functional mobility during ADLs: Min guard;Rollator (4 wheels) ?General ADL Comments: Pt reports having assistance for showering 2X a week.  He also uses his rollator to ambulate to and from the dinning hall for his meals.  ? ? ? ?Vision Baseline Vision/History: 1 Wears glasses (bifocals) ?Ability to See in Adequate Light: 0 Adequate ?Patient Visual Report: No change from  baseline ?Vision Assessment?: No apparent visual deficits  ?   ?Perception Perception ?Perception: Within Functional Limits ?  ?Praxis Praxis ?Praxis: Intact ?  ? ?Pertinent Vitals/Pain Pain Assessment ?Pain Assessment: 0-10 ?Pain Score: 0-No pain  ? ? ? ?Hand  Dominance Right ?  ?Extremity/Trunk Assessment Upper Extremity Assessment ?Upper Extremity Assessment: Overall WFL for tasks assessed ?  ?Lower Extremity Assessment ?Lower Extremity Assessment: Defer to PT evaluation ?  ?Cervical / Trunk Assessment ?Cervical / Trunk Assessment: Kyphotic ?  ?Communication Communication ?Communication: HOH ?  ?Cognition Arousal/Alertness: Awake/alert ?Behavior During Therapy: [facility] for tasks assessed/performed ?Overall Cognitive Status: Within Functional Limits for tasks assessed ?  ?  ?  ?  ?  ?  ?  ?  ?  ?  ?  ?  ?  ?  ?  ?  ?General Comments: Pt with HOH but seems to understand and exhibits cognition WFLs when explained clearly. ?  ?  ?General Comments  assist pt to sink to wash dentures and brush teeth ? ?  ?   ?   ? ? ?Home Living Family/patient expects to be discharged to:: Assisted living ?  ?  ?  ?  ?  ?  ?  ?  ?  ?  ?  ?  ?  ?  ?Home Equipment: Rollator (4 wheels) ?  ?Additional Comments: pt lives in [facility] ?  ? ?  ?Prior Functioning/Environment Prior Level of Function : Needs assist ?  ?  ?  ?  ?  ?  ?Mobility Comments: reports using rollator majority of time, furniture walks in room sometimes ?ADLs Comments: reports going to [employer] for meals or son brings him food, reports staff comes 2x/week to help him bath ?  ? ?  ?  ?OT Problem List: Decreased strength;Impaired balance (sitting and/or standing) ?  ?   ?OT Treatment/Interventions: Self-care/ADL training;Therapeutic exercise;Patient/family education;Neuromuscular education;Balance training;Therapeutic activities;DME and/or AE instruction  ?  ?OT Goals(Current goals can be found in the care plan section) Acute Rehab OT Goals ?Patient Stated Goal: to go back to the ALF ?OT Goal Formulation: With patient ?Time For Goal Achievement: 11/22/21 ?Potential to Achieve Goals: Good  ?OT Frequency: Min 2X/week ?  ? ?   ?AM-PAC OT "6 Clicks" Daily Activity     ?Outcome Measure Help from another person eating meals?: A  Little ?Help from another person taking care of personal grooming?: A Little ?Help from another person toileting, which includes using toliet, bedpan, or urinal?: A Little ?Help from another person bathing (including washing, rinsing, drying)?: A Little ?Help from another person to put on and taking off regular upper body clothing?: A Little ?Help from another person to put on and taking off regular lower body clothing?: A Little ?6 Click Score: 18 ?  ?End of Session Equipment Utilized During Treatment: Rollator (4 wheels) ?Nurse Communication: Mobility status ? ?Activity Tolerance: Patient tolerated treatment well ?Patient left: in chair;with call bell/phone within reach;with chair alarm set ? ?OT Visit Diagnosis: Unsteadiness on feet (R26.81);Repeated falls (R29.6);Muscle weakness (generalized) (M62.81)  ?              ?Time: [id number] ?OT Time Calculation (min): 33 min ?Charges:  OT General Charges ?$OT Visit: 1 Visit ?OT Evaluation ?$OT Eval Moderate Complexity: 1 Mod ?OT Treatments ?$Self Care/Home Management : 8-22 mins ? ?Jakson Delpilar OTR/L ?11/08/2021, 10:34 AM ?

## 2021-11-08 NOTE — Progress Notes (Signed)
?PROGRESS NOTE ? ? ? ?Brandon Gray  HQI:[id number] DOB: 04/26/1928 DOA: 11/06/2021 ?PCP: Uvaldo BristleYarber, Lee, NP  ?Chief Complaint  ?Patient presents with  ? Loss of Consciousness  ? ? ?Brief Narrative:  ?Brandon ParsleyCharles Gray is Brandon Gray 88 y.o. male with medical history significant for seizures, hyperlipidemia who [facility] by way of transfer [facility] department with concern for breakthrough of absence seizures after presenting from SNF to the latter facility complaining of episodes of diminished responsiveness.  ? ? ?Assessment & Plan: ?  ?Principal Problem: ?  Seizure (HCC) ?Active Problems: ?  Stroke [facility](HCC) ?  Hyperlipidemia ?  Carotid stenosis, asymptomatic ? ? ?Assessment and Plan: ?* Seizure (HCC) ?Recurrent episodes of diminished responsiveness, staring off, 10 episodes over last 3 weeks ?On lamictal at home ?Concern for breakthrough absence seizures ?Neurology recommended LTM EEG and MRI ?MRI showed acute infarct within posterior right parietal lobe ?EEG without seizure or epileptiform discharges ?Seizure precautions ?UA not c/w UTI, negative rapid urine drug screen ?  ? ?Stroke [facility](HCC) ?Appreciate neurology assistance, suspects likely 2/2 small vessel disease ?MRI with acute infarct within posterior right parietal lobe ?Neurology recs - recommending DAPT x3 weeks, then aspirin alone, needs loop recorder at discharge ?S/p loop recorder 3/27 ?Echo with ? Apical hypokinesis, LVEF 55-60% ?CTA head/neck with mixed plaque in proximal L ICA resulting in severe stenosis (80%), mixed plaque at proximal R ICA (30-40% stenosis) -. Neuro planning to follow asymptomatic stenosis outpatient ?PT/OT/SLP -> home health ?LDL 83 (simvastatin increased to 40 mg) and A1c 5.6 ? ?Carotid stenosis, asymptomatic ?Neuro to follow outpatient ? ?Hyperlipidemia ?LDL pending ?On simvastatin ?  ? ? ?DVT prophylaxis: lovenox ?Code Status: full ?Family Communication: none ?Disposition:  ? ?Status  is: Observation ?The patient will require care spanning > 2 midnights and should be moved to inpatient because: continued EEG monitoring, neurology recs, stroke w/u ?  ?Consultants:  ?neurology ? ?Procedures:  ?EEG ? ?Antimicrobials:  ?Anti-infectives (From admission, onward)  ? ? None  ? ?  ? ? ?Subjective: ?No new complaints, somewhat frustrated  ? ?Objective: ?Vitals:  ? 11/08/21 0406 11/08/21 0858 11/08/21 1231 11/08/21 1547  ?BP: 115/66 (!) 124/58 109/64 (!) 124/95  ?Pulse: 67 73 69 74  ?Resp: 18 16 13  (!) 23  ?Temp: 98 ?F (36.7 ?C) 97.9 ?F (36.6 ?C) 98.2 ?F (36.8 ?C) 97.9 ?F (36.6 ?C)  ?TempSrc:  Oral Oral Oral  ?SpO2: 96% 97% 95% 95%  ?Weight:      ?Height:      ? ? ?Intake/Output Summary (Last 24 hours) at 11/08/2021 1928 ?Last data filed at 11/08/2021 1745 ?Gross per 24 hour  ?Intake --  ?Output 525 ml  ?Net -525 ml  ? ?Filed Weights  ? 11/06/21 1429  ?Weight: 69.9 kg  ? ? ?Examination: ? ?General: No acute distress. ?Cardiovascular: RRR ?Lungs: unlabored ?Abdomen: Soft, nontender, nondistended  ?Neurological: Alert and oriented ?3. Moves all extremities ?4 . Cranial nerves II through XII grossly intact. ?Skin: Warm and dry. No rashes or lesions. ?Extremities: No clubbing or cyanosis. No edema.  ? ? ?Data Reviewed: I have personally reviewed following labs and imaging studies ? ?CBC: ?Recent Labs  ?Lab 11/06/21 ?1554 11/07/21 ?[id number] 11/08/21 ?0446  ?WBC 7.1 5.8 7.7  ?NEUTROABS 4.2 3.4 4.8  ?HGB 12.9* 12.8* 13.2  ?HCT 39.6 39.1 38.9*  ?MCV 91.0 91.1 89.8  ?PLT 244 227 232  ? ? ?Basic Metabolic Panel: ?Recent Labs  ?Lab 11/06/21 ?1554 11/07/21 ?[id number]  11/08/21 ?0446  ?NA 138 139 139  ?K 4.2 4.1 4.1  ?CL 101 106 107  ?CO2 27 26 25   ?GLUCOSE 92 92 91  ?BUN 31* 18 17  ?CREATININE 1.19 1.01 1.16  ?CALCIUM 10.1 9.6 9.6  ?MG  --  2.0 1.9  ?PHOS  --   --  3.8  ? ? ?GFR: ?Estimated Creatinine Clearance: 34.7 mL/min (by C-G formula based on SCr of 1.16 mg/dL). ? ?Liver Function Tests: ?Recent Labs  ?Lab 11/07/21 ?[id number]  11/08/21 ?0446  ?AST 19 16  ?ALT 15 16  ?ALKPHOS 64 60  ?BILITOT 0.8 0.8  ?PROT 5.8* 5.9*  ?ALBUMIN 3.8 3.8  ? ? ?CBG: ?Recent Labs  ?Lab 11/06/21 ?1432 11/07/21 ?1630  ?GLUCAP 90 156*  ? ? ? ?No results found for this or any previous visit (from the past 240 hour(s)).  ? ? ? ? ? ?Radiology Studies: ?CT ANGIO HEAD NECK W WO CM ? ?Result Date: 11/07/2021 ?CLINICAL DATA:  Follow-up stroke EXAM: CT ANGIOGRAPHY HEAD AND NECK TECHNIQUE: Multidetector CT imaging of the head and neck was performed using the standard protocol during bolus administration of intravenous contrast. Multiplanar CT image reconstructions and MIPs were obtained to evaluate the vascular anatomy. Carotid stenosis measurements (when applicable) are obtained utilizing NASCET criteria, using the distal internal carotid diameter as the denominator. RADIATION DOSE REDUCTION: This exam was performed according to the departmental dose-optimization program which includes automated exposure control, adjustment of the mA and/or kV according to patient size and/or use of iterative reconstruction technique. CONTRAST:  162mL OMNIPAQUE IOHEXOL 350 MG/ML SOLN COMPARISON:  Noncontrast CT head dated 1 day prior, same-day brain MRI FINDINGS: CT HEAD FINDINGS Brain: The known small infarcts in the right parietal lobe are not well seen on the current study. There is no evidence of evolved territorial infarct. There is no evidence of acute intracranial hemorrhage or extra-axial fluid collection. There is global parenchymal volume loss with prominence of the ventricular system and extra-axial CSF spaces. The ventricles are stable in size. Patchy hypodensity in the subcortical and periventricular white matter likely reflects sequela of chronic white matter microangiopathy. There is no mass lesion.  There is no midline shift. Vascular: See below. Skull: Normal. Negative for fracture or focal lesion. Sinuses: The paranasal sinuses are clear. Orbits: Lachandra Dettmann left lens implant is  noted. The globes and orbits are unremarkable. Review of the MIP images confirms the above findings CTA NECK FINDINGS Aortic arch: The imaged aortic arch is unremarkable. Origin of the brachiocephalic artery is not included within the field of view. The origins of the left common carotid and subclavian arteries are patent. The subclavian arteries are patent to the level imaged. Right carotid system: The right common carotid artery is patent. There is mixed plaque in the proximal right internal carotid artery resulting in up to approximally 30-40% stenosis. The distal right internal carotid artery is patent. There is severe stenosis at the origin of the right external carotid artery with patent distal branches. There is no evidence of dissection or aneurysm. Left carotid system: The left common carotid artery is patent. There is prominent mixed plaque in the distal left common carotid artery/carotid bulb resulting in severe stenosis. The exact luminal diameter is difficult to measure, but the stenosis is estimated to be approximately 80%. The distal left internal carotid artery is patent. There is moderate stenosis at the origin of the left external carotid artery. There is no evidence of dissection or aneurysm. Vertebral arteries: The vertebral arteries  are patent, without hemodynamically significant stenosis or occlusion. There is no evidence of dissection or aneurysm. Skeleton: There is mild multilevel degenerative change of the cervical spine. There is no acute osseous abnormality or aggressive osseous lesion. There is no visible canal hematoma. Other neck: Soft tissues are unremarkable. Upper chest: The imaged lung apices are clear. Review of the MIP images confirms the above findings CTA HEAD FINDINGS Anterior circulation: There is calcified atherosclerotic plaque in the bilateral intracranial ICAs resulting in up to moderate stenosis of the left supraclinoid segment and no greater than mild stenosis on the  right The bilateral MCAs are patent. Left A1 segment is hypoplastic, Jobie Popp developmental variant. Otherwise, the bilateral ACAs are patent. The anterior communicating artery is normal. There is no aneurysm or AVM. Po

## 2021-11-08 NOTE — Assessment & Plan Note (Signed)
Neuro to follow outpatient ?

## 2021-11-08 NOTE — Progress Notes (Addendum)
STROKE TEAM PROGRESS NOTE  ? ?INTERVAL HISTORY ?Patient is seen in his room with no family at the bedside.  He has been hemodynamically stable and his neurological exam is stable.  He has had no acute events overnight, and no seizures were seen on LTM EEG.  MRI scan of the brain shows an embolic right parietal patchy infarcts.  CT angiogram shows asymptomatic 80% left ICA stenosis and severe left external carotid artery stenosis.  LDL cholesterol is 83 mg percent.  Hemoglobin A1c is 5.6. ? ?Vitals:  ? 11/07/21 2327 11/08/21 0406 11/08/21 0858 11/08/21 1231  ?BP: 113/67 115/66 (!) 124/58 109/64  ?Pulse: 71 67 73 69  ?Resp: 19 18 16 13   ?Temp: 98 ?F (36.7 ?C) 98 ?F (36.7 ?C) 97.9 ?F (36.6 ?C) 98.2 ?F (36.8 ?C)  ?TempSrc: Oral  Oral Oral  ?SpO2: 94% 96% 97% 95%  ?Weight:      ?Height:      ? ?CBC:  ?Recent Labs  ?Lab 11/07/21 ?11/09/21 11/08/21 ?0446  ?WBC 5.8 7.7  ?NEUTROABS 3.4 4.8  ?HGB 12.8* 13.2  ?HCT 39.1 38.9*  ?MCV 91.1 89.8  ?PLT 227 232  ? ? ?Basic Metabolic Panel:  ?Recent Labs  ?Lab 11/07/21 ?11/09/21 11/08/21 ?0446  ?NA 139 139  ?K 4.1 4.1  ?CL 106 107  ?CO2 26 25  ?GLUCOSE 92 91  ?BUN 18 17  ?CREATININE 1.01 1.16  ?CALCIUM 9.6 9.6  ?MG 2.0 1.9  ?PHOS  --  3.8  ? ? ?Lipid Panel:  ?Recent Labs  ?Lab 11/08/21 ?0446  ?CHOL 152  ?TRIG 56  ?HDL 58  ?CHOLHDL 2.6  ?VLDL 11  ?LDLCALC 83  ? ?HgbA1c:  ?Recent Labs  ?Lab 11/08/21 ?0446  ?HGBA1C 5.6  ? ?Urine Drug Screen:  ?Recent Labs  ?Lab 11/06/21 ?2238  ?LABOPIA NONE DETECTED  ?COCAINSCRNUR NONE DETECTED  ?LABBENZ NONE DETECTED  ?AMPHETMU NONE DETECTED  ?THCU NONE DETECTED  ?LABBARB NONE DETECTED  ? ?  ?Alcohol Level No results for input(s): ETH in the last 168 hours. ? ?IMAGING past 24 hours ?CT ANGIO HEAD NECK W WO CM ? ?Result Date: 11/07/2021 ?CLINICAL DATA:  Follow-up stroke EXAM: CT ANGIOGRAPHY HEAD AND NECK TECHNIQUE: Multidetector CT imaging of the head and neck was performed using the standard protocol during bolus administration of intravenous contrast.  Multiplanar CT image reconstructions and MIPs were obtained to evaluate the vascular anatomy. Carotid stenosis measurements (when applicable) are obtained utilizing NASCET criteria, using the distal internal carotid diameter as the denominator. RADIATION DOSE REDUCTION: This exam was performed according to the departmental dose-optimization program which includes automated exposure control, adjustment of the mA and/or kV according to patient size and/or use of iterative reconstruction technique. CONTRAST:  11/09/2021 OMNIPAQUE IOHEXOL 350 MG/ML SOLN COMPARISON:  Noncontrast CT head dated 1 day prior, same-day brain MRI FINDINGS: CT HEAD FINDINGS Brain: The known small infarcts in the right parietal lobe are not well seen on the current study. There is no evidence of evolved territorial infarct. There is no evidence of acute intracranial hemorrhage or extra-axial fluid collection. There is global parenchymal volume loss with prominence of the ventricular system and extra-axial CSF spaces. The ventricles are stable in size. Patchy hypodensity in the subcortical and periventricular white matter likely reflects sequela of chronic white matter microangiopathy. There is no mass lesion.  There is no midline shift. Vascular: See below. Skull: Normal. Negative for fracture or focal lesion. Sinuses: The paranasal sinuses are clear. Orbits: A left lens implant is  noted. The globes and orbits are unremarkable. Review of the MIP images confirms the above findings CTA NECK FINDINGS Aortic arch: The imaged aortic arch is unremarkable. Origin of the brachiocephalic artery is not included within the field of view. The origins of the left common carotid and subclavian arteries are patent. The subclavian arteries are patent to the level imaged. Right carotid system: The right common carotid artery is patent. There is mixed plaque in the proximal right internal carotid artery resulting in up to approximally 30-40% stenosis. The distal right  internal carotid artery is patent. There is severe stenosis at the origin of the right external carotid artery with patent distal branches. There is no evidence of dissection or aneurysm. Left carotid system: The left common carotid artery is patent. There is prominent mixed plaque in the distal left common carotid artery/carotid bulb resulting in severe stenosis. The exact luminal diameter is difficult to measure, but the stenosis is estimated to be approximately 80%. The distal left internal carotid artery is patent. There is moderate stenosis at the origin of the left external carotid artery. There is no evidence of dissection or aneurysm. Vertebral arteries: The vertebral arteries are patent, without hemodynamically significant stenosis or occlusion. There is no evidence of dissection or aneurysm. Skeleton: There is mild multilevel degenerative change of the cervical spine. There is no acute osseous abnormality or aggressive osseous lesion. There is no visible canal hematoma. Other neck: Soft tissues are unremarkable. Upper chest: The imaged lung apices are clear. Review of the MIP images confirms the above findings CTA HEAD FINDINGS Anterior circulation: There is calcified atherosclerotic plaque in the bilateral intracranial ICAs resulting in up to moderate stenosis of the left supraclinoid segment and no greater than mild stenosis on the right The bilateral MCAs are patent. Left A1 segment is hypoplastic, a developmental variant. Otherwise, the bilateral ACAs are patent. The anterior communicating artery is normal. There is no aneurysm or AVM. Posterior circulation: There is mild plaque in the bilateral V4 segments resulting in mild stenosis on the right and no significant stenosis on the left. The basilar artery is patent. The bilateral PCAs are patent. The posterior communicating arteries are not definitely seen. There is no aneurysm or AVM. Venous sinuses: As permitted by contrast timing, patent. Anatomic  variants: None. Review of the MIP images confirms the above findings IMPRESSION: 1. The small known infarcts in the right parietal lobe are not well seen on the current study. No new acute intracranial pathology. 2. No large vessel occlusion. 3. Mild intracranial atherosclerotic disease in the bilateral intracranial ICAs and bilateral V4 segments resulting in up to moderate stenosis of the left supraclinoid segment. Otherwise, patent intracranial vasculature. 4. Mixed plaque in the proximal left ICA resulting in severe stenosis, estimated at approximally 80%. Mixed plaque at the proximal right ICA resulting in approximally 30-40% stenosis. 5. Severe stenosis at the origin of the right external carotid artery, and moderate stenosis at the origin of the left external carotid artery. 6. Patent vertebral arteries in the neck. Electronically Signed   By: Lesia Hausen M.D.   On: 11/07/2021 15:52   ? ?PHYSICAL EXAM ? ?Physical Exam  ?Constitutional: Appears well-developed and well-nourished.  Elderly Caucasian male not in distress. ?Respiratory: Effort normal, non-labored breathing ? ?Mental Status: Awake and alert. Severely hard of hearing, but will follow all commands which are repeated to him loudly. Speech is fluent, with repetition and comprehension intact.  ?Attention span and concentration were slightly diminished.  No aphasia  apraxia or dysarthria ? ?Cranial Nerves: ?II: Temporal visual fields intact. PERRL.   ?III,IV, VI: No ptosis. EOMI.  ?V: Temp sensation equal bilaterally ?VII: Smile symmetric ?VIII: Severely hard of hearing ?IX,X: No hoarseness or hypophonia ?XI: Head is midline ?XII: Midline tongue extension ?Motor: ?Right :  Upper extremity   5/5         Left:     Upper extremity   5/5 ?       Lower extremity   5/5             Lower extremity   5/5 ?Sensory: Sensation intact to light touch in all extremities  ?Gait: Deferred ? ?  ? ? ?ASSESSMENT/PLAN ?Brandon Gray is a 88 y.o. male with history  of HLD, hearing loss and seizures, on Lamictal 200 mg BID, who presented for evaluation from nursing home for episodes of unresponsiveness and staring spells where he has no recollection of those events and he

## 2021-11-08 NOTE — Discharge Instructions (Signed)
Care After Your Loop Recorder ?  ?Monitor your cardiac device site for redness, swelling, and drainage. Call the device clinic at [phone] if you experience these symptoms or fever/chills. ? ?You may shower 3 days after your loop recorder implant and wash your incision with soap and water. Avoid lotions, ointments, or perfumes over your incision until it is well-healed. ? ?You may use a hot tub or a pool AFTER your wound check appointment if the incision is completely closed. ? ?Your device is MRI compatible.  ? ?Remote monitoring is used to monitor your cardiac device from home. This monitoring is scheduled every month days by our office. It allows [place] to keep an eye on the functioning of your device to ensure it is working properly. ? ?

## 2021-11-08 NOTE — Evaluation (Signed)
Physical Therapy Evaluation ?Patient Details ?Name: Brandon Gray ?MRN: [id number] ?DOB: [birth year] ?Today's Date: 11/08/2021 ? ?History of Present Illness ? Brandon Gray is a 88 y.o. male who is admitted to [facility] on 11/06/2021 by way of transfer from [facility] emergency department with concern for breakthrough of absence seizures after presenting from SNF to the latter facility complaining of episodes of diminished responsiveness. 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  PMH: seizures, hyperlipidemia, bilat hearing aides ?  ?Clinical Impression ? Pt admitted with above. Pt was living at [facility] using a rollator. Pt was amb to dinning hall and receiving assist 2x/week for bathing in addition to full medication management by facility. Pt extremely HOH however is oriented and follows commands. Pt ambulation limited today by being hooked up to continuous EEG. Suspect pt will progress well enough to return to ALF. Pt aware his balance is off and would benefit from HHPT to improved balance, strength, and decrease falls risk. Acute PT to cont to follow.   ?   ? ?Recommendations for follow up therapy are one component of a multi-disciplinary discharge planning process, led by the attending physician.  Recommendations may be updated based on patient status, additional functional criteria and insurance authorization. ? ?Follow Up Recommendations Home health PT ? ?  ?Assistance Recommended at Discharge Intermittent Supervision/Assistance  ?Patient can return home with the following ? A lot of help with bathing/dressing/bathroom;Assistance with cooking/housework;Direct supervision/assist for medications management;Direct supervision/assist for financial management;Assist for transportation ? ?  ?Equipment Recommendations    ?Recommendations for Other Services ?    ?  ?Functional Status Assessment Patient has had a recent decline in their functional status and demonstrates the ability to make  significant improvements in function in a reasonable and predictable amount of time.  ? ?  ?Precautions / Restrictions Precautions ?Precautions: Fall ?Precaution Comments: extremely HOH, continuos EEG ?Restrictions ?Weight Bearing Restrictions: No  ? ?  ? ?Mobility ? Bed Mobility ?Overal bed mobility: Needs Assistance ?Bed Mobility: Supine to Sit ?  ?  ?Supine to sit: Min assist, HOB elevated ?  ?  ?General bed mobility comments: pt initiated transfer, minA to scoot hips to edge and for line management ?  ? ?Transfers ?Overall transfer level: Needs assistance ?Equipment used: Rollator (4 wheels) ?Transfers: Sit to/from Stand, Bed to chair/wheelchair/BSC ?Sit to Stand: Min guard ?  ?Step pivot transfers: Min guard ?  ?  ?  ?General transfer comment: verbal cues for safe hand placement, PT to assist with line management and provide verbal cues for safety, no physical assist needed ?  ? ?Ambulation/Gait ?Ambulation/Gait assistance: Min guard ?Gait Distance (Feet): 20 Feet ?Assistive device: Rollator (4 wheels) ?Gait Pattern/deviations: Step-to pattern, Decreased stride length, Shuffle, Narrow base of support, Trunk flexed ?Gait velocity: dec ?Gait velocity interpretation: <1.31 ft/sec, indicative of household ambulator ?  ?General Gait Details: ambulation limited by continuos EEG, walked back/forth to wall in room, pt with good walker management , min guard for directions and to manage turning to sit in the chair, assist from PT to manage lines ? ?Stairs ?  ?  ?  ?  ?  ? ?Wheelchair Mobility ?  ? ?Modified Rankin (Stroke Patients Only) ?Modified Rankin (Stroke Patients Only) ?Pre-Morbid Rankin Score: Moderately severe disability ?Modified Rankin: Moderately severe disability ? ?  ? ?Balance Overall balance assessment: Needs assistance ?Sitting-balance support: No upper extremity supported, Feet supported ?Sitting balance-Leahy Scale: Fair ?  ?  ?Standing balance support: During functional activity, Single extremity  supported ?Standing balance-Leahy Scale: Fair ?Standing balance comment: pt able to stand at sink and use both hands to wash dentures, but did lean on counter slightly, needs rollator for safe ambulation ?  ?  ?  ?  ?  ?  ?  ?  ?  ?  ?  ?   ? ? ? ?Pertinent Vitals/Pain Pain Assessment ?Pain Assessment: No/denies pain  ? ? ?Home Living Family/patient expects to be discharged to:: Assisted living ?  ?  ?  ?  ?  ?  ?  ?  ?Home Equipment: Rollator (4 wheels) ?Additional Comments: pt lives in [facility]  ?  ?Prior Function Prior Level of Function : Needs assist ?  ?  ?  ?  ?  ?  ?Mobility Comments: reports using rollator majority of time, furniture walks in room sometimes ?ADLs Comments: reports going to [employer] for meals or son brings him food, reports staff comes 2x/week to help him bath ?  ? ? ?Hand Dominance  ? Dominant Hand: Right ? ?  ?Extremity/Trunk Assessment  ? Upper Extremity Assessment ?Upper Extremity Assessment: Defer to OT evaluation (R grip weaker than L however overall functional for age) ?  ? ?Lower Extremity Assessment ?Lower Extremity Assessment: Generalized weakness ?  ? ?Cervical / Trunk Assessment ?Cervical / Trunk Assessment: Kyphotic  ?Communication  ? Communication: HOH (extremely [place] despite hearing aides)  ?Cognition Arousal/Alertness: Awake/alert ?Behavior During Therapy: [facility] for tasks assessed/performed ?Overall Cognitive Status: Within Functional Limits for tasks assessed ?  ?  ?  ?  ?  ?  ?  ?  ?  ?  ?  ?  ?  ?  ?  ?  ?General Comments: pt with decresaed insight to safety regarding EEG lines and IV asking why he can't go to the bathroom himself with his rollator however patient is extremely HOH, once explained better pt understood why he can't get up by himself ?  ?  ? ?  ?General Comments General comments (skin integrity, edema, etc.): assist pt to sink to wash dentures and brush teeth ? ?  ?Exercises    ? ?Assessment/Plan  ?  ?PT Assessment Patient needs continued PT services  ?PT  Problem List Decreased strength;Decreased activity tolerance;Decreased balance;Decreased mobility;Decreased knowledge of use of DME;Decreased safety awareness ? ?   ?  ?PT Treatment Interventions Gait training;Stair training;DME instruction;Functional mobility training;Therapeutic activities;Therapeutic exercise;Balance training;Neuromuscular re-education   ? ?PT Goals (Current goals can be found in the Care Plan section)  ?Acute Rehab PT Goals ?Patient Stated Goal: home ?PT Goal Formulation: With patient ?Time For Goal Achievement: 11/22/21 ?Potential to Achieve Goals: Good ? ?  ?Frequency Min 3X/week ?  ? ? ?Co-evaluation   ?  ?  ?  ?  ? ? ?  ?AM-PAC PT "6 Clicks" Mobility  ?Outcome Measure Help needed turning from your back to your side while in a flat bed without using bedrails?: A Little ?Help needed moving from lying on your back to sitting on the side of a flat bed without using bedrails?: A Little ?Help needed moving to and from a bed to a chair (including a wheelchair)?: A Little ?Help needed standing up from a chair using your arms (e.g., wheelchair or bedside chair)?: A Little ?Help needed to walk in hospital room?: A Little ?Help needed climbing 3-5 steps with a railing? : A Lot ?6 Click Score: 17 ? ?  ?End of Session Equipment Utilized During Treatment: Gait belt (EEG connected) ?Activity  Tolerance: Patient tolerated treatment well ?Patient left: in chair;with call bell/phone within reach;with chair alarm set ?Nurse Communication: Mobility status ?PT Visit Diagnosis: Unsteadiness on feet (R26.81);Muscle weakness (generalized) (M62.81) ?  ? ?Time: [id number] ?PT Time Calculation (min) (ACUTE ONLY): 29 min ? ? ?Charges:   PT Evaluation ?$PT Eval Moderate Complexity: 1 Mod ?PT Treatments ?$Gait Training: 8-22 mins ?  ?   ? ? ?Kittie Plater, PT, DPT ?Acute Rehabilitation Services ?Pager #: [phone] ?Office #: [phone] ? ? ?Nancie Bocanegra M Somaya Grassi ?11/08/2021, 9:49 AM ? ?

## 2021-11-08 NOTE — Evaluation (Signed)
Speech Language Pathology Evaluation ?Patient Details ?Name: Brandon Gray ?MRN: [id number] ?DOB: [birth year] ?Today's Date: 11/08/2021 ?Time: [id number] ?SLP Time Calculation (min) (ACUTE ONLY): 25 min ? ?Problem List:  ?Patient Active Problem List  ? Diagnosis Date Noted  ? Stroke [facility]) 11/07/2021  ? Seizure ([place]) 11/06/2021  ? Pure hypercholesterolemia 11/06/2021  ? Sensorineural hearing loss, bilateral 11/06/2021  ? History of TIA (transient ischemic attack) 11/06/2021  ? Seizure disorder ([place]) 09/07/2020  ? Hyperlipidemia 09/07/2020  ? Hearing loss 09/07/2020  ? ?Past Medical History:  ?Past Medical History:  ?Diagnosis Date  ? Hearing loss   ? Hyperlipidemia   ? Seizures ([place])   ? ?Past Surgical History: History reviewed. No pertinent surgical history. ?HPI:  ?Pt is a 88 y.o. male who is admitted to [facility] on 11/06/2021 by way of transfer from [facility] emergency department with concern for breakthrough of absence seizures after presenting from SNF to the latter facility complaining of episodes of diminished responsiveness. 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  PMH: seizures, hyperlipidemia, bilat hearing aides  ? ?Assessment / Plan / Recommendation ?Clinical Impression ? Pt participated in speech-language-cognition evaluation. Pt reported that he lives in an ALF and has noticed increased difficulty with memory for the past few months. He stated that his medications are managed by the ALF and that his son manages his bills. His speech and language skills were WFL. The [facility] Mental Status Examination was completed to evaluate the pt's cognitive-linguistic skills. However, pt requested that the delayed recall section not be completed due to concerns of it causing him anxiety and those section being "detrimental to what you're trying to achieve." His score was therefore adjusted and he achieved an adjusted score of 12/25 which is below the normal limits. He exhibited  difficulty in the areas of memory, complex problem solving, and executive function. Pt reported that he believes his performance today is at baseline and that responsibilities continue to be taken from him due to his declining cognition. Based on PT and OT's notes, these impairments do not appear to be overt during more functional/less abstract tasks. However, SLP services may be beneficial at next venue of care. ?   ?SLP Assessment ? SLP Recommendation/Assessment: All further Speech Lanaguage Pathology  needs can be addressed in the next venue of care ?SLP Visit Diagnosis: Cognitive communication deficit (R41.841)  ?  ?Recommendations for follow up therapy are one component of a multi-disciplinary discharge planning process, led by the attending physician.  Recommendations may be updated based on patient status, additional functional criteria and insurance authorization. ?   ?Follow Up Recommendations ? Home health SLP  ?  ?Assistance Recommended at Discharge ? Intermittent Supervision/Assistance  ?Functional Status Assessment Patient has not had a recent decline in their functional status  ?Frequency and Duration    ?  ?  ?   ?SLP Evaluation ?Cognition ? Overall Cognitive Status: History of cognitive impairments - at baseline ?Arousal/Alertness: Awake/alert ?Orientation Level: Oriented X4 ?Year: 2023 ?Month: March ?Day of Week: Correct ?Attention: Focused;Sustained;Selective ?Focused Attention: Appears intact ?Sustained Attention: Impaired ?Sustained Attention Impairment: Verbal complex ?Selective Attention: Appears intact ?Memory: Impaired ?Memory Impairment: Retrieval deficit (Immediate & delayed: pt refused; paragraph: 4/8) ?Awareness: Appears intact ?Problem Solving: Impaired ?Problem Solving Impairment: Verbal complex (Mental calculations: 0/3) ?Executive Function: Sequencing;Organizing ?Sequencing: Impaired ?Sequencing Impairment: Verbal complex (Clock drawing: 2/4) ?Organizing: Impaired ?Organizing  Impairment: Verbal complex (backward digit span: 0/2)  ?  ?   ?Comprehension ? Auditory Comprehension ?Overall Auditory  Comprehension: Appears within functional limits for tasks assessed ?Yes/No Questions: Within Functional Limits ?Conversation: Complex  ?  ?Expression Expression ?Primary Mode of Expression: Verbal ?Verbal Expression ?Overall Verbal Expression: Appears within functional limits for tasks assessed ?Initiation: No impairment ?Level of Generative/Spontaneous Verbalization: Conversation ?Repetition: No impairment ?Naming: No impairment ?Pragmatics: No impairment ?Written Expression ?Dominant Hand: Right   ?Oral / Motor ? Oral Motor/Sensory Function ?Overall Oral Motor/Sensory Function: Within functional limits ?Motor Speech ?Overall Motor Speech: Appears within functional limits for tasks assessed ?Respiration: Within functional limits ?Phonation: Normal ?Resonance: Within functional limits ?Articulation: Within functional limitis ?Intelligibility: Intelligible ?Motor Planning: Witnin functional limits ?Motor Speech Errors: Not applicable   ?        ?Fionnuala Hemmerich I. Hardin Negus, [place], CCC-SLP ?Acute Rehabilitation Services ?Office number [phone] ?Pager [phone] ? ?Horton Marshall ?11/08/2021, 12:06 PM ? ? ?

## 2021-11-09 ENCOUNTER — Encounter (HOSPITAL_COMMUNITY): Payer: Self-pay | Admitting: Cardiology

## 2021-11-09 DIAGNOSIS — R569 Unspecified convulsions: Secondary | ICD-10-CM | POA: Diagnosis not present

## 2021-11-09 MED ORDER — CLOPIDOGREL BISULFATE 75 MG PO TABS
75.0000 mg | ORAL_TABLET | Freq: Every day | ORAL | 0 refills | Status: AC
Start: 2021-11-10 — End: 2021-11-29

## 2021-11-09 MED ORDER — ASPIRIN 81 MG PO TBEC: 81.0000 mg | DELAYED_RELEASE_TABLET | Freq: Every day | ORAL | 11 refills | Status: DC

## 2021-11-09 MED ORDER — SIMVASTATIN 40 MG PO TABS: 40.0000 mg | ORAL_TABLET | Freq: Every day | ORAL | 1 refills | Status: DC

## 2021-11-09 NOTE — [place] FL2 (Addendum)
?[place] MEDICAID FL2 LEVEL OF CARE SCREENING TOOL  ?  ? ?IDENTIFICATION  ?Patient Name: ?Brandon Gray Birthdate: Jul 30, 1928 Sex: male Admission Date (Current Location): ?11/06/2021  ?[place] Number: ? [place] ?  Facility and Address:  ?[facility], [address] [place], [place] [address] ?     Provider Number: ?[id number]  ?Attending Physician Name and Address:  ?Zigmund Daniel., * ? Relative Name and Phone Number:  ?  ?   ?Current Level of Care: ?Hospital Recommended Level of Care: ?Assisted Living Facility Prior Approval Number: ?  ? ?Date Approved/Denied: ?  PASRR Number: ?  ? ?Discharge Plan: ?Other (Comment) (ALF) ?  ? ?Current Diagnoses: ?Patient Active Problem List  ? Diagnosis Date Noted  ? Carotid stenosis, asymptomatic 11/08/2021  ? Stroke [facility]) 11/07/2021  ? Seizure ([facility]) 11/06/2021  ? Pure hypercholesterolemia 11/06/2021  ? Sensorineural hearing loss, bilateral 11/06/2021  ? History of TIA (transient ischemic attack) 11/06/2021  ? Seizure disorder ([facility]) 09/07/2020  ? Hyperlipidemia 09/07/2020  ? Hearing loss 09/07/2020  ? ? ?Orientation RESPIRATION BLADDER Height & Weight   ?  ?Self, Time, Situation, Place ? Normal Continent Weight: 154 lb 5.2 oz (70 kg) ?Height:  5\' 4"  (162.6 cm)  ?BEHAVIORAL SYMPTOMS/MOOD NEUROLOGICAL BOWEL NUTRITION STATUS  ?  Convulsions/Seizures Continent Diet (regular)  ?AMBULATORY STATUS COMMUNICATION OF NEEDS Skin   ?Limited Assist Verbally Normal ?  ?  ?  ?    ?     ?     ? ? ?Personal Care Assistance Level of Assistance  ?Bathing, Feeding, Dressing Bathing Assistance: Limited assistance ?Feeding assistance: Independent ?Dressing Assistance: Limited assistance ?   ? ?Functional Limitations Info  ?Sight, Hearing Sight Info: Impaired ?Hearing Info: Impaired (hard of hearing) ?   ? ? ?SPECIAL CARE FACTORS FREQUENCY  ?PT (By licensed PT), OT (By licensed OT)   ?  ?PT Frequency: 3x/wk with home health ?OT Frequency: 3x/wk with  home health ?  ?  ?  ?   ? ? ?Contractures Contractures Info: Not present  ? ? ?Additional Factors Info  ?Code Status, Allergies Code Status Info: Full ?Allergies Info: NKA ?  ?  ?  ?   ? ?Current Medications (11/09/2021):  This is the current hospital active medication list ?Current Facility-Administered Medications  ?Medication Dose Route Frequency Provider Last Rate Last Admin  ? acetaminophen (TYLENOL) tablet 650 mg  650 mg Oral Q6H PRN Howerter, Justin B, DO      ? Or  ? acetaminophen (TYLENOL) suppository 650 mg  650 mg Rectal Q6H PRN Howerter, Justin B, DO      ? aspirin EC tablet 81 mg  81 mg Oral Daily 11/11/2021, MD   81 mg at 11/09/21 1111  ? clopidogrel (PLAVIX) tablet 75 mg  75 mg Oral Daily 11/11/21, MD   75 mg at 11/09/21 1111  ? enoxaparin (LOVENOX) injection 40 mg  40 mg Subcutaneous Q24H 11/11/21., MD   40 mg at 11/08/21 2205  ? lamoTRIgine (LAMICTAL) tablet 200 mg  200 mg Oral BID Howerter, Justin B, DO   200 mg at 11/09/21 1111  ? LORazepam (ATIVAN) injection 1 mg  1 mg Intravenous Q2H PRN Howerter, Justin B, DO      ? melatonin tablet 3 mg  3 mg Oral QHS PRN Howerter, Justin B, DO   3 mg at 11/08/21 2220  ? polyethylene glycol (MIRALAX / GLYCOLAX) packet 17  g  17 g Oral Daily Howerter, Justin B, DO   17 g at 11/09/21 1111  ? simvastatin (ZOCOR) tablet 40 mg  40 mg Oral Daily de Saintclair Halsted, Cortney E, NP   40 mg at 11/09/21 1111  ? ? ? ?Discharge Medications: ?TAKE these medications   ?  ?aspirin 81 MG EC tablet ?Take 1 tablet (81 mg total) by mouth daily. Swallow whole. ?   ?clopidogrel 75 MG tablet ?Commonly known as: PLAVIX ?Take 1 tablet (75 mg total) by mouth daily for 19 days. (Take plavix with aspirin for 19 more days, then take aspirin alone) ?Start taking on: November 10, 2021 ?   ?lamoTRIgine 200 MG tablet ?Commonly known as: LAMICTAL ?Take 1 tablet (200 mg total) by mouth 2 (two) times daily. ?   ?melatonin 5 MG Tabs ?Take 5 mg by mouth at bedtime. ?   ?simvastatin  40 MG tablet ?Commonly known as: ZOCOR ?Take 1 tablet (40 mg total) by mouth daily. ?What changed:  ?medication strength ?how much to take  ? ? ?Relevant Imaging Results: ? ?Relevant Lab Results: ? ? ?Additional Information ?SS#: [id number] ? ?Baldemar Lenis, LCSW ? ? ? ? ?

## 2021-11-09 NOTE — TOC Transition Note (Signed)
Transition of Care (TOC) - CM/SW Discharge Note ? ? ?Patient Details  ?Name: Brandon Gray ?MRN: [id number] ?Date of Birth: [birth year] ? ?Transition of Care [facility]) CM/SW Contact:  ?Geralynn Ochs, LCSW ?Phone Number: ?11/09/2021, 1:50 PM ? ? ?Clinical Narrative:   CSW confirmed with [place] that patient can return. [facility] will setup home health, CSW sent orders. CSW sent discharge information and confirmed receipt. Patient's son to provide transportation. No other needs identified at this time. ? ?Nurse to call report to [phone]. ? ? ? ?Final next level of care: Assisted Living ?Barriers to Discharge: Barriers Resolved ? ? ?Patient Goals and CMS Choice ?Patient states their goals for this hospitalization and ongoing recovery are:: to get back home ?CMS Medicare.gov Compare Post Acute Care list provided to:: Patient ?Choice offered to / list presented to : Patient ? ?Discharge Placement ?  ?           ?Patient chooses bed at:  ([place]) ?Patient to be transferred to facility by: Family ?Name of family member notified: Self ?Patient and family notified of of transfer: 11/09/21 ? ?Discharge Plan and Services ?  ?  ?           ?  ?  ?  ?  ?  ?HH Arranged: PT, OT ?[place] Agency: [place] ?  ?  ?  ? ?Social Determinants of Health (SDOH) Interventions ?  ? ? ?Readmission Risk Interventions ?   ? View : No data to display.  ?  ?  ?  ? ? ? ? ? ?

## 2021-11-09 NOTE — Discharge Summary (Signed)
Physician Discharge Summary  ?Brandon Gray [id number] DOB: [birth year] DOA: 11/06/2021 ? ?PCP: Earmon Phoenix, NP ? ?Admit date: 11/06/2021 ?Discharge date: 11/09/2021 ? ?Time spent: 40 minutes ? ?Recommendations for Outpatient Follow-up:  ?CBC/CMP  ?Follow with neurology outpatient ?Follow carotid stenosis ?Follow ? Apical hypokinesis outpatient ?Follow loop recorder outpatient  ? ?Discharge Diagnoses:  ?Principal Problem: ?  Seizure ([place]) ?Active Problems: ?  Stroke [facility]) ?  Hyperlipidemia ?  Carotid stenosis, asymptomatic ? ? ?Discharge Condition: stable ? ?Diet recommendation: heart healthy ? ?Filed Weights  ? 11/06/21 1429 11/09/21 0500  ?Weight: 69.9 kg 70 kg  ? ? ?History of present illness:  ?Brandon Gray is Brandon Gray 88 y.o. male with medical history significant for seizures, hyperlipidemia who is admitted to [facility] on 11/06/2021 by way of transfer from [facility] emergency department with concern for breakthrough of absence seizures after presenting from SNF to the latter facility complaining of episodes of diminished responsiveness.  He had MRI with small acute infarct within posterior right parietal lobe.  EEG was negative.  Seen by neurology who recommended DAPT x21 days, then aspirin alone, simvastatin increased, loop recorder placed prior to discharge.  No changes to AED meds recommended. ? ?See below for additional details ? ?Hospital Course:  ?Assessment and Plan: ?* Seizure ([place]) ?Recurrent episodes of diminished responsiveness, staring off, 10 episodes over last 3 weeks ?On lamictal at home ?Concern for breakthrough absence seizures ?Neurology recommended LTM EEG and MRI ?MRI showed acute infarct within posterior right parietal lobe ?EEG without seizure or epileptiform discharges ?Seizure precautions ?UA not c/w UTI, negative rapid urine drug screen ?  ? ?Stroke [facility]) ?Appreciate neurology assistance, suspects likely 2/2 small vessel disease ?MRI with acute infarct within posterior  right parietal lobe ?Neurology recs - recommending DAPT x3 weeks, then aspirin alone, needs loop recorder at discharge ?S/p loop recorder 3/27 ?Echo with ? Apical hypokinesis, LVEF 55-60% ?CTA head/neck with mixed plaque in proximal L ICA resulting in severe stenosis (80%), mixed plaque at proximal R ICA (30-40% stenosis) -. Neuro planning to follow asymptomatic stenosis outpatient ?PT/OT/SLP -> home health ?LDL 83 (simvastatin increased to 40 mg) and A1c 5.6 ? ?Carotid stenosis, asymptomatic ?Neuro to follow outpatient ? ?Hyperlipidemia ?LDL pending ?On simvastatin ?  ? ? ?Procedures: ? 1. Successful implantation of [employer] implantable loop recorder for Shontelle Muska history of cryptogenic stroke  2. No early apparent complications.  On 3/27 ? ? Echo ?IMPRESSIONS  ? ? ? 1. Technically difficult; probable apical hypokinesis with overall  ?preserved LV function; aortic valve not well visualized but appears  ?calcified with restricted motion but no AS by doppler.  ? 2. Left ventricular ejection fraction, by estimation, is 55 to 60%. The  ?left ventricle has normal function. The left ventricle demonstrates  ?regional wall motion abnormalities (see scoring diagram/findings for  ?description). There is mild left ventricular  ? hypertrophy. Left ventricular diastolic function could not be evaluated.  ? 3. Right ventricular systolic function is normal. The right ventricular  ?size is normal.  ? 4. The mitral valve is normal in structure. Trivial mitral valve  ?regurgitation. No evidence of mitral stenosis.  ? 5. The aortic valve is calcified. Aortic valve regurgitation is not  ?visualized. No aortic stenosis is present.  ? 6. Aortic dilatation noted. There is borderline dilatation of the aortic  ?root, measuring 38 mm.  ? ?Comparison(s): No prior Echocardiogram.  ? ?Consultations: ?Cardiology ?neurology ? ?Discharge Exam: ?Vitals:  ? 11/09/21 0357 11/09/21 0907  ?  BP: (!) 135/56 113/61  ?Pulse: 71 78  ?Resp: 16 (!)  21  ?Temp: 97.9 ?F (36.6 ?C) 97.6 ?F (36.4 ?C)  ?SpO2: 96% 94%  ? ?No new complaints ?Discussed with son over phone ? ?General: No acute distress. ?Cardiovascular: RRR ?Lungs: unlabored ?Abdomen: Soft, nontender, nondistended  ?Neurological: Alert and oriented ?3. Moves all extremities ?4 . Cranial nerves II through XII grossly intact. ?Skin: Warm and dry. No rashes or lesions. ?Extremities: No clubbing or cyanosis. No edema. ? ?Discharge Instructions ? ? ?Discharge Instructions   ? ? Ambulatory referral to Cardiology   Complete by: As directed ?  ? Ambulatory referral to Neurology   Complete by: As directed ?  ? An appointment is requested in approximately: 4 weeks  ? Call MD for:  difficulty breathing, headache or visual disturbances   Complete by: As directed ?  ? Call MD for:  extreme fatigue   Complete by: As directed ?  ? Call MD for:  hives   Complete by: As directed ?  ? Call MD for:  persistant dizziness or light-headedness   Complete by: As directed ?  ? Call MD for:  persistant nausea and vomiting   Complete by: As directed ?  ? Call MD for:  redness, tenderness, or signs of infection (pain, swelling, redness, odor or green/yellow discharge around incision site)   Complete by: As directed ?  ? Call MD for:  severe uncontrolled pain   Complete by: As directed ?  ? Call MD for:  temperature >100.4   Complete by: As directed ?  ? Diet - low sodium heart healthy   Complete by: As directed ?  ? Discharge instructions   Complete by: As directed ?  ? You were seen for concern for seizures.  Your EEG monitoring did not show any seizure activity.  ? ?You did have Delonna Ney stroke.  We'll send you home with aspirin and plavix for an additional 19 days, then you'll transition to aspirin alone.  We increased your simvastatin to 40 mg.  Follow up with neurology as an outpatient. ? ?You had carotid artery disease that you'll follow up with Dr. Leonie Man to monitor. ? ?We placed Rever [place]te loop recorder to monitor for abnormal heart  rhythms.  Follow up with cardiology regarding this.  Your echo showed possible wall motion abnormality, you can follow with cardiology outpatient regarding this as well. ? ?Return for new, recurrent, or worsening symptoms. ? ?Please ask your PCP to request records from this hospitalization so they know what was done and what the next steps will be.  ? Increase activity slowly   Complete by: As directed ?  ? ?  ? ?Allergies as of 11/09/2021   ?No Known Allergies ?  ? ?  ?Medication List  ?  ? ?TAKE these medications   ? ?aspirin 81 MG EC tablet ?Take 1 tablet (81 mg total) by mouth daily. Swallow whole. ?  ?clopidogrel 75 MG tablet ?Commonly known as: PLAVIX ?Take 1 tablet (75 mg total) by mouth daily for 19 days. (Take plavix with aspirin for 19 more days, then take aspirin alone) ?Start taking on: November 10, 2021 ?  ?lamoTRIgine 200 MG tablet ?Commonly known as: LAMICTAL ?Take 1 tablet (200 mg total) by mouth 2 (two) times daily. ?  ?melatonin 5 MG Tabs ?Take 5 mg by mouth at bedtime. ?  ?simvastatin 40 MG tablet ?Commonly known as: ZOCOR ?Take 1 tablet (40 mg total) by mouth daily. ?What changed:  ?  medication strength ?how much to take ?  ? ?  ? ?No Known Allergies ? ? ? ?The results of significant diagnostics from this hospitalization (including imaging, microbiology, ancillary and laboratory) are listed below for reference.   ? ?Significant Diagnostic Studies: ?CT ANGIO HEAD NECK W WO CM ? ?Result Date: 11/07/2021 ?CLINICAL DATA:  Follow-up stroke EXAM: CT ANGIOGRAPHY HEAD AND NECK TECHNIQUE: Multidetector CT imaging of the head and neck was performed using the standard protocol during bolus administration of intravenous contrast. Multiplanar CT image reconstructions and MIPs were obtained to evaluate the vascular anatomy. Carotid stenosis measurements (when applicable) are obtained utilizing NASCET criteria, using the distal internal carotid diameter as the denominator. RADIATION DOSE REDUCTION: This exam was  performed according to the departmental dose-optimization program which includes automated exposure control, adjustment of the mA and/or kV according to patient size and/or use of iterative reconstruction technique.

## 2021-11-09 NOTE — Progress Notes (Signed)
Physical Therapy Treatment ?Patient Details ?Name: Brandon Gray ?MRN: [id number] ?DOB: 03/10/1928 ?Today's Date: 11/09/2021 ? ? ?History of Present Illness Brandon Gray is a 88 y.o. male who is admitted to [facility] on 11/06/2021 by way of transfer from [facility] emergency department with concern for breakthrough of absence seizures after presenting from SNF to the latter facility complaining of episodes of diminished responsiveness. 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  Loop recorder placed on 3/27. PMH: seizures, hyperlipidemia, bilat hearing aides ? ?  ?PT Comments  ? ? Pt no longer on continuous EEG. Pt functioning at baseline. Pt supervision/min guard with rollator. Pt able to stand at sink and perform ADLs without external support or LOB. Acute to cont to follow to maintain function and minimize decline while in hospital. Acute PT to continue to follow. ?   ?Recommendations for follow up therapy are one component of a multi-disciplinary discharge planning process, led by the attending physician.  Recommendations may be updated based on patient status, additional functional criteria and insurance authorization. ? ?Follow Up Recommendations ? Home health PT ?  ?  ?Assistance Recommended at Discharge Intermittent Supervision/Assistance  ?Patient can return home with the following A lot of help with bathing/dressing/bathroom;Assistance with cooking/housework;Direct supervision/assist for medications management;Direct supervision/assist for financial management;Assist for transportation ?  ?Equipment Recommendations ?    ?  ?Recommendations for Other Services   ? ? ?  ?Precautions / Restrictions Precautions ?Precautions: Fall ?Precaution Comments: extremely HOH ?Restrictions ?Weight Bearing Restrictions: No  ?  ? ?Mobility ? Bed Mobility ?  ?  ?  ?  ?  ?  ?  ?General bed mobility comments: pt standing at EOB wtih RN upon PT arrival ?  ? ?Transfers ?Overall transfer level: Needs  assistance ?Equipment used: Rollator (4 wheels) ?Transfers: Sit to/from Stand, Bed to chair/wheelchair/BSC ?Sit to Stand: Min guard ?  ?  ?  ?  ?  ?General transfer comment: min guard for safety, verbal cues for safe hand placement ?  ? ?Ambulation/Gait ?Ambulation/Gait assistance: Min guard ?Gait Distance (Feet): 400 Feet ?Assistive device: Rollator (4 wheels) ?Gait Pattern/deviations: Trunk flexed, Step-through pattern ?Gait velocity: dec ?Gait velocity interpretation: 1.31 - 2.62 ft/sec, indicative of limited community ambulator ?  ?General Gait Details: pt back to baseline gait pattern, good walker management, no need for seated rest, steady ? ? ?Stairs ?  ?  ?  ?  ?  ? ? ?Wheelchair Mobility ?  ? ?Modified Rankin (Stroke Patients Only) ?Modified Rankin (Stroke Patients Only) ?Pre-Morbid Rankin Score: Moderate disability ?Modified Rankin: Moderate disability ? ? ?  ?Balance Overall balance assessment: Needs assistance ?Sitting-balance support: No upper extremity supported, Feet supported ?Sitting balance-Leahy Scale: Good ?  ?  ?Standing balance support: During functional activity, Single extremity supported ?Standing balance-Leahy Scale: Good ?Standing balance comment: pt able to stand at sink and use both hands to wash dentures without leaning on walker ?  ?  ?  ?  ?  ?  ?  ?  ?  ?  ?  ?  ? ?  ?Cognition Arousal/Alertness: Awake/alert ?Behavior During Therapy: [facility] for tasks assessed/performed ?Overall Cognitive Status: History of cognitive impairments - at baseline ?  ?  ?  ?  ?  ?  ?  ?  ?  ?  ?  ?  ?  ?  ?  ?  ?General Comments: HOH but insightful, appears to comprehend tasks ?  ?  ? ?  ?Exercises   ? ?  ?  General Comments General comments (skin integrity, edema, etc.): VSS ?  ?  ? ?Pertinent Vitals/Pain Pain Assessment ?Pain Assessment: No/denies pain  ? ? ?Home Living   ?  ?  ?  ?  ?  ?  ?  ?  ?  ?   ?  ?Prior Function    ?  ?  ?   ? ?PT Goals (current goals can now be found in the care plan section) Acute  Rehab PT Goals ?Patient Stated Goal: home ?PT Goal Formulation: With patient ?Time For Goal Achievement: 11/22/21 ?Potential to Achieve Goals: Good ? ?  ?Frequency ? ? ? Min 3X/week ? ? ? ?  ?PT Plan    ? ? ?Co-evaluation   ?  ?  ?  ?  ? ?  ?AM-PAC PT "6 Clicks" Mobility   ?Outcome Measure ? Help needed turning from your back to your side while in a flat bed without using bedrails?: A Little ?Help needed moving from lying on your back to sitting on the side of a flat bed without using bedrails?: A Little ?Help needed moving to and from a bed to a chair (including a wheelchair)?: A Little ?Help needed standing up from a chair using your arms (e.g., wheelchair or bedside chair)?: A Little ?Help needed to walk in hospital room?: A Little ?Help needed climbing 3-5 steps with a railing? : A Lot ?6 Click Score: 17 ? ?  ?End of Session Equipment Utilized During Treatment: Gait belt (EEG connected) ?Activity Tolerance: Patient tolerated treatment well ?Patient left: in chair;with call bell/phone within reach;with chair alarm set ?Nurse Communication: Mobility status ?PT Visit Diagnosis: Unsteadiness on feet (R26.81);Muscle weakness (generalized) (M62.81) ?  ? ? ?Time: 4235-3614 ?PT Time Calculation (min) (ACUTE ONLY): 25 min ? ?Charges:  $Gait Training: 8-22 mins ?$Therapeutic Activity: 8-22 mins          ?          ? ?Lewis Shock, PT, DPT ?Acute Rehabilitation Services ?Pager #: [phone] ?Office #: [phone] ? ? ? ?Jason Frisbee M Idaly Verret ?11/09/2021, 8:42 AM ? ?

## 2021-11-09 NOTE — Plan of Care (Signed)
?  Problem: Education: ?Goal: Knowledge of General Education information will improve ?Description: Including pain rating scale, medication(s)/side effects and non-pharmacologic comfort measures ?Outcome: Progressing ?  ?Problem: Health Behavior/Discharge Planning: ?Goal: Ability to manage health-related needs will improve ?Outcome: Progressing ?  ?Problem: Clinical Measurements: ?Goal: Ability to maintain clinical measurements within normal limits will improve ?Outcome: Progressing ?Goal: Will remain free from infection ?Outcome: Progressing ?Goal: Diagnostic test results will improve ?Outcome: Progressing ?Goal: Respiratory complications will improve ?Outcome: Progressing ?Goal: Cardiovascular complication will be avoided ?Outcome: Progressing ?  ?Problem: Activity: ?Goal: Risk for activity intolerance will decrease ?Outcome: Progressing ?  ?Problem: Nutrition: ?Goal: Adequate nutrition will be maintained ?Outcome: Progressing ?  ?Problem: Coping: ?Goal: Level of anxiety will decrease ?Outcome: Progressing ?  ?Problem: Elimination: ?Goal: Will not experience complications related to bowel motility ?Outcome: Progressing ?Goal: Will not experience complications related to urinary retention ?Outcome: Progressing ?  ?Problem: Pain Managment: ?Goal: General experience of comfort will improve ?Outcome: Progressing ?  ?Problem: Safety: ?Goal: Ability to remain free from injury will improve ?Outcome: Progressing ?  ?Problem: Skin Integrity: ?Goal: Risk for impaired skin integrity will decrease ?Outcome: Progressing ?  ?Problem: Education: ?Goal: Expressions of having a comfortable level of knowledge regarding the disease process will increase ?Outcome: Progressing ?  ?Problem: Coping: ?Goal: Ability to adjust to condition or change in health will improve ?Outcome: Progressing ?Goal: Ability to identify appropriate support needs will improve ?Outcome: Progressing ?  ?Problem: Health Behavior/Discharge Planning: ?Goal:  Compliance with prescribed medication regimen will improve ?Outcome: Progressing ?  ?Problem: Medication: ?Goal: Risk for medication side effects will decrease ?Outcome: Progressing ?  ?Problem: Clinical Measurements: ?Goal: Complications related to the disease process, condition or treatment will be avoided or minimized ?Outcome: Progressing ?Goal: Diagnostic test results will improve ?Outcome: Progressing ?  ?Problem: Safety: ?Goal: Verbalization of understanding the information provided will improve ?Outcome: Progressing ?  ?Problem: Self-Concept: ?Goal: Level of anxiety will decrease ?Outcome: Progressing ?Goal: Ability to verbalize feelings about condition will improve ?Outcome: Progressing ?  ?Problem: Nutrition: ?Goal: Dietary intake will improve ?Outcome: Progressing ?  ?Problem: Education: ?Goal: Knowledge of secondary prevention will improve (SELECT ALL) ?Outcome: Progressing ?  ?Problem: Education: ?Goal: Knowledge of secondary prevention will improve (SELECT ALL) ?Outcome: Progressing ?Goal: Knowledge of patient specific risk factors will improve (INDIVIDUALIZE FOR PATIENT) ?Outcome: Progressing ?  ?

## 2021-11-10 DIAGNOSIS — I440 Atrioventricular block, first degree: Secondary | ICD-10-CM | POA: Diagnosis not present

## 2021-11-10 DIAGNOSIS — H9193 Unspecified hearing loss, bilateral: Secondary | ICD-10-CM | POA: Diagnosis not present

## 2021-11-10 DIAGNOSIS — I69318 Other symptoms and signs involving cognitive functions following cerebral infarction: Secondary | ICD-10-CM | POA: Diagnosis not present

## 2021-11-10 DIAGNOSIS — M6281 Muscle weakness (generalized): Secondary | ICD-10-CM | POA: Diagnosis not present

## 2021-11-10 DIAGNOSIS — R2689 Other abnormalities of gait and mobility: Secondary | ICD-10-CM | POA: Diagnosis not present

## 2021-11-10 DIAGNOSIS — I69398 Other sequelae of cerebral infarction: Secondary | ICD-10-CM | POA: Diagnosis not present

## 2021-11-10 DIAGNOSIS — N189 Chronic kidney disease, unspecified: Secondary | ICD-10-CM | POA: Diagnosis not present

## 2021-11-10 DIAGNOSIS — I499 Cardiac arrhythmia, unspecified: Secondary | ICD-10-CM | POA: Diagnosis not present

## 2021-11-10 DIAGNOSIS — E785 Hyperlipidemia, unspecified: Secondary | ICD-10-CM | POA: Diagnosis not present

## 2021-11-10 DIAGNOSIS — R569 Unspecified convulsions: Secondary | ICD-10-CM | POA: Diagnosis not present

## 2021-11-11 ENCOUNTER — Telehealth: Payer: Self-pay

## 2021-11-11 DIAGNOSIS — Z95818 Presence of other cardiac implants and grafts: Secondary | ICD-10-CM | POA: Diagnosis not present

## 2021-11-11 DIAGNOSIS — E785 Hyperlipidemia, unspecified: Secondary | ICD-10-CM | POA: Diagnosis not present

## 2021-11-11 DIAGNOSIS — G40909 Epilepsy, unspecified, not intractable, without status epilepticus: Secondary | ICD-10-CM | POA: Diagnosis not present

## 2021-11-11 DIAGNOSIS — G459 Transient cerebral ischemic attack, unspecified: Secondary | ICD-10-CM | POA: Diagnosis not present

## 2021-11-11 NOTE — Telephone Encounter (Signed)
Dr. Lalla Brothers we are getting a lot of false AF episodes  105 in 4 days, the longest episode has been two hours and , see report below there are clear P-waves. He is implanted for cryptogenic  ? ?Can we program AF response to Least AF events ? He is currently programmed to balanced.  ? ? ? ? ? ? ? ? ? ? ? ? ? ?

## 2021-11-18 ENCOUNTER — Ambulatory Visit: Payer: Medicare Other

## 2021-11-18 NOTE — Progress Notes (Signed)
CARDIOLOGY CONSULT NOTE  ? ? ? ? ? ?Patient ID: ?Brandon Gray ?MRN: [id number] ?DOB/AGE: 20-May-1928 88 y.o. ? ?Admit date: (Not on file) ?Referring Physician: Lacretia Nicks ?Primary Physician: Uvaldo Bristle, NP ?Primary Cardiologist: New ?Reason for Consultation: Abnormal echo ? ?Active Problems: ?  * No active hospital problems. * ? ? ?HPI:  88 y.o. referred by Dr Lowell Guitar post hospital d/c for abnormal echo. He was admitted to [facility] 3/25-3/28 for CVA History of seizures Had episode of diminished responsiveness MRI with small acute posterior right parietal lobe infarct EEG negative Neurology recommended DAPT for 3 weeks then asa alone Already on statin for HLD  Seen by EP Dr Lalla Brothers and no PAF in hospital ILR placed  ? ?Reviewed echo and did not appear to be any serious issues given his age EF normal 55-60% but ? Apical hypokinesis mild LVH trivial MR AV sclerosis Aortic root 3.8 Bubble study not done no obvious PFO and definity not given To my eye there was no evidence of LV mural apical thrombus  ? ?ECG Sinus arrhythmia PR 248 msec RBBB rate 78 bpm  ?Telemetry no PAF ? ?I noted that CT angio of neck showed severe stenosis 80% in the proximal left ICA but Dr Pearlean Brownie indicated this is "unrelated" ? Because infarct was right parietal  ? ?He resides at [facility] assisted living Son looks after him well Doing well since d/c Has ?Wound check for ILR next Wends day  ? ?ROS ?All other systems reviewed and negative except as noted above ? ?Past Medical History:  ?Diagnosis Date  ? Hearing loss   ? Hyperlipidemia   ? Seizures (HCC)   ?  ?Family History  ?Problem Relation Age of Onset  ? Diabetes Father   ? Cancer Daughter   ? Early death Daughter   ?  ?Social History  ? ?Socioeconomic History  ? Marital status: Widowed  ?  Spouse name: Not on file  ? Number of children: Not on file  ? Years of education: Not on file  ? Highest education level: Not on file  ?Occupational History  ? Not on file  ?Tobacco Use  ? Smoking status:  Never  ? Smokeless tobacco: Never  ?Vaping Use  ? Vaping Use: Never used  ?Substance and Sexual Activity  ? Alcohol use: Yes  ?  Alcohol/week: 7.0 standard drinks  ?  Types: 7 Glasses of wine per week  ? Drug use: Never  ? Sexual activity: Not Currently  ?Other Topics Concern  ? Not on file  ?Social History Narrative  ? Not on file  ? ?Social Determinants of Health  ? ?Financial Resource Strain: Low Risk   ? Difficulty of Paying Living Expenses: Not hard at all  ?Food Insecurity: No Food Insecurity  ? Worried About Programme researcher, broadcasting/film/video in the Last Year: Never true  ? Ran Out of Food in the Last Year: Never true  ?Transportation Needs: No Transportation Needs  ? Lack of Transportation (Medical): No  ? Lack of Transportation (Non-Medical): No  ?Physical Activity: Inactive  ? Days of Exercise per Week: 0 days  ? Minutes of Exercise per Session: 0 min  ?Stress: No Stress Concern Present  ? Feeling of Stress : Not at all  ?Social Connections: Socially Isolated  ? Frequency of Communication with Friends and Family: More than three times a week  ? Frequency of Social Gatherings with Friends and Family: Once a week  ? Attends Religious Services: Never  ? Active Member  of Clubs or Organizations: No  ? Attends Banker Meetings: Never  ? Marital Status: Widowed  ?Intimate Partner Violence: Not At Risk  ? Fear of Current or Ex-Partner: No  ? Emotionally Abused: No  ? Physically Abused: No  ? Sexually Abused: No  ?  ?Past Surgical History:  ?Procedure Laterality Date  ? LOOP RECORDER INSERTION N/A 11/08/2021  ? Procedure: LOOP RECORDER INSERTION;  Surgeon: Lanier Prude, MD;  Location: [facility] INVASIVE CV LAB;  Service: Cardiovascular;  Laterality: N/A;  ?  ? ? ?Current Outpatient Medications:  ?  aspirin EC 81 MG EC tablet, Take 1 tablet (81 mg total) by mouth daily. Swallow whole., Disp: 30 tablet, Rfl: 11 ?  clopidogrel (PLAVIX) 75 MG tablet, Take 1 tablet (75 mg total) by mouth daily for 19 days. (Take plavix with  aspirin for 19 more days, then take aspirin alone), Disp: 19 tablet, Rfl: 0 ?  lamoTRIgine (LAMICTAL) 200 MG tablet, Take 1 tablet (200 mg total) by mouth 2 (two) times daily., Disp: 180 tablet, Rfl: 3 ?  melatonin 5 MG TABS, Take 5 mg by mouth at bedtime., Disp: , Rfl:  ?  simvastatin (ZOCOR) 40 MG tablet, Take 1 tablet (40 mg total) by mouth daily., Disp: 30 tablet, Rfl: 1 ? ? ? ?Physical Exam: ?Blood pressure 136/62, pulse 72, height 5\' 4"  (1.626 m), weight 145 lb (65.8 kg), SpO2 96 %.   ? ?Affect appropriate ?Healthy:  appears stated age ?HEENT: normal ?Neck supple with no adenopathy ?JVP normal no bruits no thyromegaly ?Lungs clear with no wheezing and good diaphragmatic motion ?Heart:  S1/S2 SEM  murmur, no rub, gallop or click ?PMI normal  ILR under left pectoral area  ?Abdomen: benighn, BS positve, no tenderness, no AAA ?no bruit.  No HSM or HJR ?Distal pulses intact with no bruits ?No edema ?Neuro non-focal ?Skin warm and dry ?No muscular weakness ? ? ?Labs: ?  ?Lab Results  ?Component Value Date  ? WBC 7.7 11/08/2021  ? HGB 13.2 11/08/2021  ? HCT 38.9 (L) 11/08/2021  ? MCV 89.8 11/08/2021  ? PLT 232 11/08/2021  ? No results for input(s): NA, K, CL, CO2, BUN, CREATININE, CALCIUM, PROT, BILITOT, ALKPHOS, ALT, AST, GLUCOSE in the last 168 hours. ? ?Invalid input(s): LABALBU ?No results found for: CKTOTAL, CKMB, CKMBINDEX, TROPONINI  ?Lab Results  ?Component Value Date  ? CHOL 152 11/08/2021  ? CHOL 193 09/07/2020  ? CHOL 180 03/27/2020  ? ?Lab Results  ?Component Value Date  ? HDL 58 11/08/2021  ? HDL 65.80 09/07/2020  ? HDL 76 (A) 03/27/2020  ? ?Lab Results  ?Component Value Date  ? LDLCALC 83 11/08/2021  ? LDLCALC 93 09/07/2020  ? LDLCALC 82 03/27/2020  ? ?Lab Results  ?Component Value Date  ? TRIG 56 11/08/2021  ? TRIG 170.0 (H) 09/07/2020  ? TRIG 109 03/27/2020  ? ?Lab Results  ?Component Value Date  ? CHOLHDL 2.6 11/08/2021  ? CHOLHDL 3 09/07/2020  ? ?No results found for: LDLDIRECT  ?  ?Radiology: ?CT  ANGIO HEAD NECK W WO CM ? ?Result Date: 11/07/2021 ?CLINICAL DATA:  Follow-up stroke EXAM: CT ANGIOGRAPHY HEAD AND NECK TECHNIQUE: Multidetector CT imaging of the head and neck was performed using the standard protocol during bolus administration of intravenous contrast. Multiplanar CT image reconstructions and MIPs were obtained to evaluate the vascular anatomy. Carotid stenosis measurements (when applicable) are obtained utilizing NASCET criteria, using the distal internal carotid diameter as the denominator. RADIATION  DOSE REDUCTION: This exam was performed according to the departmental dose-optimization program which includes automated exposure control, adjustment of the mA and/or kV according to patient size and/or use of iterative reconstruction technique. CONTRAST:  OMNIPAQUE IOHEXOL 350 MG/ML SOLN COMPARISON:  Noncontrast CT head dated 1 day prior, same-day brain MRI FINDINGS: CT HEAD FINDINGS Brain: The known small infarcts in the right parietal lobe are not well seen on the current study. There is no evidence of evolved territorial infarct. There is no evidence of acute intracranial hemorrhage or extra-axial fluid collection. There is global parenchymal volume loss with prominence of the ventricular system and extra-axial CSF spaces. The ventricles are stable in size. Patchy hypodensity in the subcortical and periventricular white matter likely reflects sequela of chronic white matter microangiopathy. There is no mass lesion.  There is no midline shift. Vascular: See below. Skull: Normal. Negative for fracture or focal lesion. Sinuses: The paranasal sinuses are clear. Orbits: A left lens implant is noted. The globes and orbits are unremarkable. Review of the MIP images confirms the above findings CTA NECK FINDINGS Aortic arch: The imaged aortic arch is unremarkable. Origin of the brachiocephalic artery is not included within the field of view. The origins of the left common carotid and subclavian  arteries are patent. The subclavian arteries are patent to the level imaged. Right carotid system: The right common carotid artery is patent. There is mixed plaque in the proximal right internal carotid

## 2021-11-19 ENCOUNTER — Encounter: Payer: Self-pay | Admitting: Cardiovascular Disease

## 2021-11-19 ENCOUNTER — Ambulatory Visit: Payer: Medicare Other | Admitting: Cardiovascular Disease

## 2021-11-19 VITALS — BP 136/62 | HR 72 | Ht 64.0 in | Wt 145.0 lb

## 2021-11-19 DIAGNOSIS — E785 Hyperlipidemia, unspecified: Secondary | ICD-10-CM

## 2021-11-19 DIAGNOSIS — I4510 Unspecified right bundle-branch block: Secondary | ICD-10-CM

## 2021-11-19 DIAGNOSIS — I6522 Occlusion and stenosis of left carotid artery: Secondary | ICD-10-CM

## 2021-11-19 DIAGNOSIS — R931 Abnormal findings on diagnostic imaging of heart and coronary circulation: Secondary | ICD-10-CM | POA: Diagnosis not present

## 2021-11-19 DIAGNOSIS — I639 Cerebral infarction, unspecified: Secondary | ICD-10-CM

## 2021-11-19 NOTE — Patient Instructions (Addendum)
Medication Instructions:  ?Your physician recommends that you continue on your current medications as directed. Please refer to the Current Medication list given to you today. ? ?*If you need a refill on your cardiac medications before your next appointment, please call your pharmacy* ? ?Lab Work: ?If you have labs (blood work) drawn today and your tests are completely normal, you will receive your results only by: ?MyChart Message (if you have MyChart) OR ?A paper copy in the mail ?If you have any lab test that is abnormal or we need to change your treatment, we will call you to review the results. ? ?Testing/Procedures: ?Your physician has requested that you have an limited echocardiogram. Echocardiography is a painless test that uses sound waves to create images of your heart. It provides your doctor with information about the size and shape of your heart and how well your heart?s chambers and valves are working. This procedure takes approximately one hour. There are no restrictions for this procedure. ? ?Your physician has requested that you have a carotid duplex. This test is an ultrasound of the carotid arteries in your neck. It looks at blood flow through these arteries that supply the brain with blood. Allow one hour for this exam. There are no restrictions or special instructions. ? ?Follow-Up: ?At [facility], you and your health needs are our priority.  As part of our continuing mission to provide you with exceptional heart care, we have created designated Provider Care Teams.  These Care Teams include your primary Cardiologist (physician) and Advanced Practice Providers (APPs -  Physician Assistants and Nurse Practitioners) who all work together to provide you with the care you need, when you need it. ? ?We recommend signing up for the patient portal called "MyChart".  Sign up information is provided on this After Visit Summary.  MyChart is used to connect with patients for Virtual Visits  (Telemedicine).  Patients are able to view lab/test results, encounter notes, upcoming appointments, etc.  Non-urgent messages can be sent to your provider as well.   ?To learn more about what you can do with MyChart, go to [email or web address].   ? ?Your next appointment:   ?Next available ? ?The format for your next appointment:   ?In Person ? ?Provider:   ?You may see Dr. Lalla Brothers or one of the following Advanced Practice Providers on your designated Care Team:   ?Francis Dowse, PA-C ?Casimiro Needle "Otilio Saber, PA-C{ ? ? ?You have been referred to Vascular Surgery. ? ? ?

## 2021-11-24 ENCOUNTER — Ambulatory Visit (INDEPENDENT_AMBULATORY_CARE_PROVIDER_SITE_OTHER): Payer: Medicare Other

## 2021-11-24 NOTE — Progress Notes (Signed)
ILR wound check in clinic. Steri strips removed. Wound well healed. Home monitor transmitting nightly. No episodes. Questions answered.  

## 2021-11-24 NOTE — Patient Instructions (Addendum)
? ?  After Your Loop Recorder ? ? ?Monitor your loop site site for redness, swelling, and drainage. Call the device clinic at [phone] if you experience these symptoms. ? ?Your incision was closed with Steri-strips:  You may shower wash your incision with soap and water.  ? ?Your loop recorder is MRI compatible. ? ?Remote monitoring is used to monitor your loop recorder from home. This monitoring is scheduled every 31 days by our office. It allows [place] to keep an eye on the functioning of your device to ensure it is working properly. Please keep your remote monitor plugged in at bedside.  ? ?No additional restrictions ? ?

## 2021-12-10 ENCOUNTER — Ambulatory Visit (HOSPITAL_COMMUNITY)
Admission: RE | Admit: 2021-12-10 | Discharge: 2021-12-10 | Disposition: A | Discharge status: Home / Self Care | Payer: Medicare Other | Source: Ambulatory Visit | Attending: Cardiology | Admitting: Cardiology

## 2021-12-10 ENCOUNTER — Other Ambulatory Visit: Payer: Self-pay | Admitting: Cardiovascular Disease

## 2021-12-10 ENCOUNTER — Ambulatory Visit (HOSPITAL_BASED_OUTPATIENT_CLINIC_OR_DEPARTMENT_OTHER): Payer: Medicare Other

## 2021-12-10 DIAGNOSIS — E785 Hyperlipidemia, unspecified: Secondary | ICD-10-CM | POA: Diagnosis not present

## 2021-12-10 DIAGNOSIS — I6523 Occlusion and stenosis of bilateral carotid arteries: Secondary | ICD-10-CM | POA: Diagnosis not present

## 2021-12-10 DIAGNOSIS — R931 Abnormal findings on diagnostic imaging of heart and coronary circulation: Secondary | ICD-10-CM

## 2021-12-10 DIAGNOSIS — I639 Cerebral infarction, unspecified: Secondary | ICD-10-CM | POA: Insufficient documentation

## 2021-12-10 DIAGNOSIS — I6389 Other cerebral infarction: Secondary | ICD-10-CM | POA: Diagnosis not present

## 2021-12-10 LAB — ECHOCARDIOGRAM LIMITED BUBBLE STUDY
Area-P 1/2: 3.17 cm2
S' Lateral: 2.8 cm

## 2021-12-10 MED ORDER — PERFLUTREN LIPID MICROSPHERE
1.0000 mL | INTRAVENOUS | Status: AC | PRN
  Administered 2021-12-10: 2 mL via INTRAVENOUS

## 2021-12-13 ENCOUNTER — Ambulatory Visit (INDEPENDENT_AMBULATORY_CARE_PROVIDER_SITE_OTHER): Payer: Medicare Other

## 2021-12-13 DIAGNOSIS — I639 Cerebral infarction, unspecified: Secondary | ICD-10-CM | POA: Diagnosis not present

## 2021-12-13 LAB — CUP PACEART REMOTE DEVICE CHECK
Date Time Interrogation Session: 20230501090838
Implantable Pulse Generator Implant Date: 20230327
Pulse Gen Serial Number: 177206

## 2021-12-15 DIAGNOSIS — I499 Cardiac arrhythmia, unspecified: Secondary | ICD-10-CM | POA: Diagnosis not present

## 2021-12-15 DIAGNOSIS — I69318 Other symptoms and signs involving cognitive functions following cerebral infarction: Secondary | ICD-10-CM | POA: Diagnosis not present

## 2021-12-15 DIAGNOSIS — I69398 Other sequelae of cerebral infarction: Secondary | ICD-10-CM | POA: Diagnosis not present

## 2021-12-15 DIAGNOSIS — Z9181 History of falling: Secondary | ICD-10-CM | POA: Diagnosis not present

## 2021-12-15 DIAGNOSIS — Z7902 Long term (current) use of antithrombotics/antiplatelets: Secondary | ICD-10-CM | POA: Diagnosis not present

## 2021-12-15 DIAGNOSIS — H9193 Unspecified hearing loss, bilateral: Secondary | ICD-10-CM | POA: Diagnosis not present

## 2021-12-15 DIAGNOSIS — N189 Chronic kidney disease, unspecified: Secondary | ICD-10-CM | POA: Diagnosis not present

## 2021-12-15 DIAGNOSIS — M6281 Muscle weakness (generalized): Secondary | ICD-10-CM | POA: Diagnosis not present

## 2021-12-15 DIAGNOSIS — R569 Unspecified convulsions: Secondary | ICD-10-CM | POA: Diagnosis not present

## 2021-12-15 DIAGNOSIS — R2689 Other abnormalities of gait and mobility: Secondary | ICD-10-CM | POA: Diagnosis not present

## 2021-12-15 DIAGNOSIS — E785 Hyperlipidemia, unspecified: Secondary | ICD-10-CM | POA: Diagnosis not present

## 2021-12-15 DIAGNOSIS — I440 Atrioventricular block, first degree: Secondary | ICD-10-CM | POA: Diagnosis not present

## 2021-12-15 DIAGNOSIS — Z7982 Long term (current) use of aspirin: Secondary | ICD-10-CM | POA: Diagnosis not present

## 2021-12-28 NOTE — Progress Notes (Signed)
[employer] loop ?

## 2021-12-31 ENCOUNTER — Encounter: Payer: Medicare Other | Admitting: Vascular Surgery

## 2022-01-24 ENCOUNTER — Ambulatory Visit: Payer: Medicare Other | Admitting: Neurology

## 2022-01-27 ENCOUNTER — Ambulatory Visit: Payer: Medicare Other | Admitting: Neurology

## 2022-02-10 ENCOUNTER — Ambulatory Visit: Payer: Medicare Other

## 2022-02-10 DIAGNOSIS — Z95818 Presence of other cardiac implants and grafts: Secondary | ICD-10-CM | POA: Diagnosis not present

## 2022-02-10 DIAGNOSIS — G459 Transient cerebral ischemic attack, unspecified: Secondary | ICD-10-CM | POA: Diagnosis not present

## 2022-02-10 DIAGNOSIS — N183 Chronic kidney disease, stage 3 unspecified: Secondary | ICD-10-CM | POA: Diagnosis not present

## 2022-02-10 DIAGNOSIS — Z1331 Encounter for screening for depression: Secondary | ICD-10-CM | POA: Diagnosis not present

## 2022-02-14 ENCOUNTER — Ambulatory Visit: Payer: Medicare Other

## 2022-02-21 ENCOUNTER — Ambulatory Visit (INDEPENDENT_AMBULATORY_CARE_PROVIDER_SITE_OTHER): Payer: Medicare Other

## 2022-02-21 DIAGNOSIS — I639 Cerebral infarction, unspecified: Secondary | ICD-10-CM | POA: Diagnosis not present

## 2022-02-21 LAB — CUP PACEART REMOTE DEVICE CHECK
Date Time Interrogation Session: 20230710082854
Implantable Pulse Generator Implant Date: 20230327
Pulse Gen Serial Number: 177206

## 2022-02-23 DIAGNOSIS — F5101 Primary insomnia: Secondary | ICD-10-CM | POA: Diagnosis not present

## 2022-03-03 DIAGNOSIS — I10 Essential (primary) hypertension: Secondary | ICD-10-CM | POA: Diagnosis not present

## 2022-03-03 DIAGNOSIS — E785 Hyperlipidemia, unspecified: Secondary | ICD-10-CM | POA: Diagnosis not present

## 2022-03-10 NOTE — Progress Notes (Signed)
Bsx loop recorder 

## 2022-03-21 DIAGNOSIS — H2511 Age-related nuclear cataract, right eye: Secondary | ICD-10-CM | POA: Diagnosis not present

## 2022-03-21 DIAGNOSIS — H21501 Unspecified adhesions of iris, right eye: Secondary | ICD-10-CM | POA: Diagnosis not present

## 2022-03-22 NOTE — Progress Notes (Signed)
Carelink Summary Report / Loop Recorder 

## 2022-04-05 DIAGNOSIS — H2511 Age-related nuclear cataract, right eye: Secondary | ICD-10-CM | POA: Diagnosis not present

## 2022-04-06 DIAGNOSIS — F5101 Primary insomnia: Secondary | ICD-10-CM | POA: Diagnosis not present

## 2022-04-25 ENCOUNTER — Ambulatory Visit (INDEPENDENT_AMBULATORY_CARE_PROVIDER_SITE_OTHER): Payer: Medicare Other

## 2022-04-25 DIAGNOSIS — I639 Cerebral infarction, unspecified: Secondary | ICD-10-CM

## 2022-04-25 LAB — CUP PACEART REMOTE DEVICE CHECK
Date Time Interrogation Session: 20230911075307
Implantable Pulse Generator Implant Date: 20230327
Pulse Gen Serial Number: 177206

## 2022-05-12 NOTE — Progress Notes (Signed)
Carelink Summary Report / Loop Recorder 

## 2022-05-20 DIAGNOSIS — F5101 Primary insomnia: Secondary | ICD-10-CM | POA: Diagnosis not present

## 2022-05-23 ENCOUNTER — Telehealth: Payer: Self-pay | Admitting: Cardiovascular Disease

## 2022-05-23 NOTE — Telephone Encounter (Signed)
Spoke to patients son [name] - states the PA who was seeing patient today at [facility] said his heart was irregular and she would need clearance prior to scheduling. Patient was seen this am in office. Will await fax. Bruce updated.    [employer], [place] [phone]

## 2022-05-23 NOTE — Telephone Encounter (Signed)
Pt son states pt has an appt with [facility] for an upcoming cataract surgery he is having and they noticed he had an irregular heartbeat and suggested he contact cardiology

## 2022-05-24 ENCOUNTER — Telehealth: Payer: Self-pay | Admitting: *Deleted

## 2022-05-24 NOTE — Telephone Encounter (Signed)
   Pre-operative Risk Assessment    Patient Name: Brandon Gray  DOB: August 25, 1927 MRN: [id number]      Request for Surgical Clearance    Procedure:   CATARACT EXTRACTION BY PE, IOL-RIGHT  Date of Surgery:  Clearance 06/08/22                                 Surgeon:  DR. Ernestina Penna Surgeon's Group or Practice Name:  [facility] Phone number:  [phone] EX: [phone] Fax number:  [phone]   Type of Clearance Requested:   - Medical    Type of Anesthesia:   IV SEDATION   Additional requests/questions:   PT'S HEART RHYTHM WAS IRREGULAR DURING EXAM IN THE OFFICE AND HAS LOOP RECORDER  Signed, Jeanann Lewandowsky   05/24/2022, 7:07 AM

## 2022-05-24 NOTE — Telephone Encounter (Signed)
Remote received 05/23/22- ILR alert for AF 3 false AF event showing SR with first degree AV block/WAP + ectopy, duration's 10-32min

## 2022-05-24 NOTE — Telephone Encounter (Signed)
   Patient Name: Brandon Gray  DOB: 15-Jun-1928 MRN: [id number]  Primary Cardiologist: Jenkins Rouge, MD  Chart reviewed as part of pre-operative protocol coverage. Cataract extractions are recognized in guidelines as low risk surgeries that do not typically require specific preoperative testing or holding of blood thinner therapy. Therefore, given past medical history and time since last visit, based on ACC/AHA guidelines, Brandon Gray would be at acceptable risk for the planned procedure without further cardiovascular testing.   Remote transmission reviewed and revealed sinus rhythm with ectopy. He may proceed with low risk procedure without further testing.   I will route this recommendation to the requesting party via Epic fax function and remove from pre-op pool.  Please call with questions.  Emmaline Life, NP-C  05/24/2022, 10:55 AM [address] Office [phone] Fax [phone]

## 2022-05-24 NOTE — Telephone Encounter (Signed)
Please contact patient and/or son and advise them to send a remote device check so that we can evaluate the rhythm. Since we do not typically hold anticoagulation or conduct an assessment prior to cataract surgery, I do not suspect an abnormal rhythm would cause [place] to withhold his clearance but we will await transmission.

## 2022-05-24 NOTE — Telephone Encounter (Signed)
I will send to device clinic to reach out to the pt to send a remote transmission needed for pre op per Christen Bame, NP

## 2022-05-26 ENCOUNTER — Ambulatory Visit (INDEPENDENT_AMBULATORY_CARE_PROVIDER_SITE_OTHER): Payer: Medicare Other

## 2022-05-26 DIAGNOSIS — I639 Cerebral infarction, unspecified: Secondary | ICD-10-CM

## 2022-05-26 DIAGNOSIS — R09A2 Foreign body sensation, throat: Secondary | ICD-10-CM | POA: Diagnosis not present

## 2022-05-26 DIAGNOSIS — N183 Chronic kidney disease, stage 3 unspecified: Secondary | ICD-10-CM | POA: Diagnosis not present

## 2022-05-26 DIAGNOSIS — Z95818 Presence of other cardiac implants and grafts: Secondary | ICD-10-CM | POA: Diagnosis not present

## 2022-05-26 DIAGNOSIS — Z9841 Cataract extraction status, right eye: Secondary | ICD-10-CM | POA: Diagnosis not present

## 2022-05-26 LAB — CUP PACEART REMOTE DEVICE CHECK
Date Time Interrogation Session: 20231012062029
Implantable Pulse Generator Implant Date: 20230327
Pulse Gen Serial Number: 177206

## 2022-06-02 NOTE — Progress Notes (Signed)
Carelink Summary Report / Loop Recorder 

## 2022-06-08 DIAGNOSIS — H269 Unspecified cataract: Secondary | ICD-10-CM | POA: Diagnosis not present

## 2022-06-08 DIAGNOSIS — H2511 Age-related nuclear cataract, right eye: Secondary | ICD-10-CM | POA: Diagnosis not present

## 2022-06-09 DIAGNOSIS — R09A2 Foreign body sensation, throat: Secondary | ICD-10-CM | POA: Diagnosis not present

## 2022-06-09 DIAGNOSIS — Z9841 Cataract extraction status, right eye: Secondary | ICD-10-CM | POA: Diagnosis not present

## 2022-06-15 DIAGNOSIS — R1314 Dysphagia, pharyngoesophageal phase: Secondary | ICD-10-CM | POA: Diagnosis not present

## 2022-06-15 DIAGNOSIS — R41841 Cognitive communication deficit: Secondary | ICD-10-CM | POA: Diagnosis not present

## 2022-06-15 DIAGNOSIS — H900 Conductive hearing loss, bilateral: Secondary | ICD-10-CM | POA: Diagnosis not present

## 2022-06-16 DIAGNOSIS — R41841 Cognitive communication deficit: Secondary | ICD-10-CM | POA: Diagnosis not present

## 2022-06-16 DIAGNOSIS — H900 Conductive hearing loss, bilateral: Secondary | ICD-10-CM | POA: Diagnosis not present

## 2022-06-16 DIAGNOSIS — R1314 Dysphagia, pharyngoesophageal phase: Secondary | ICD-10-CM | POA: Diagnosis not present

## 2022-06-21 DIAGNOSIS — H900 Conductive hearing loss, bilateral: Secondary | ICD-10-CM | POA: Diagnosis not present

## 2022-06-21 DIAGNOSIS — R41841 Cognitive communication deficit: Secondary | ICD-10-CM | POA: Diagnosis not present

## 2022-06-21 DIAGNOSIS — R1314 Dysphagia, pharyngoesophageal phase: Secondary | ICD-10-CM | POA: Diagnosis not present

## 2022-06-23 DIAGNOSIS — I8390 Asymptomatic varicose veins of unspecified lower extremity: Secondary | ICD-10-CM | POA: Diagnosis not present

## 2022-06-23 DIAGNOSIS — H9190 Unspecified hearing loss, unspecified ear: Secondary | ICD-10-CM | POA: Diagnosis not present

## 2022-06-23 DIAGNOSIS — Z7982 Long term (current) use of aspirin: Secondary | ICD-10-CM | POA: Diagnosis not present

## 2022-06-23 DIAGNOSIS — E785 Hyperlipidemia, unspecified: Secondary | ICD-10-CM | POA: Diagnosis not present

## 2022-06-24 DIAGNOSIS — R41841 Cognitive communication deficit: Secondary | ICD-10-CM | POA: Diagnosis not present

## 2022-06-24 DIAGNOSIS — H900 Conductive hearing loss, bilateral: Secondary | ICD-10-CM | POA: Diagnosis not present

## 2022-06-24 DIAGNOSIS — R1314 Dysphagia, pharyngoesophageal phase: Secondary | ICD-10-CM | POA: Diagnosis not present

## 2022-06-27 ENCOUNTER — Ambulatory Visit (INDEPENDENT_AMBULATORY_CARE_PROVIDER_SITE_OTHER): Payer: Medicare Other

## 2022-06-27 DIAGNOSIS — H900 Conductive hearing loss, bilateral: Secondary | ICD-10-CM | POA: Diagnosis not present

## 2022-06-27 DIAGNOSIS — I639 Cerebral infarction, unspecified: Secondary | ICD-10-CM

## 2022-06-27 DIAGNOSIS — R41841 Cognitive communication deficit: Secondary | ICD-10-CM | POA: Diagnosis not present

## 2022-06-27 DIAGNOSIS — R1314 Dysphagia, pharyngoesophageal phase: Secondary | ICD-10-CM | POA: Diagnosis not present

## 2022-06-28 LAB — CUP PACEART REMOTE DEVICE CHECK
Date Time Interrogation Session: 20231113040000
Implantable Pulse Generator Implant Date: 20230327
Pulse Gen Serial Number: 177206

## 2022-06-29 DIAGNOSIS — H524 Presbyopia: Secondary | ICD-10-CM | POA: Diagnosis not present

## 2022-07-01 DIAGNOSIS — R41841 Cognitive communication deficit: Secondary | ICD-10-CM | POA: Diagnosis not present

## 2022-07-01 DIAGNOSIS — R1314 Dysphagia, pharyngoesophageal phase: Secondary | ICD-10-CM | POA: Diagnosis not present

## 2022-07-01 DIAGNOSIS — H900 Conductive hearing loss, bilateral: Secondary | ICD-10-CM | POA: Diagnosis not present

## 2022-07-02 ENCOUNTER — Encounter (HOSPITAL_BASED_OUTPATIENT_CLINIC_OR_DEPARTMENT_OTHER): Payer: Self-pay | Admitting: Emergency Medicine

## 2022-07-02 ENCOUNTER — Emergency Department (HOSPITAL_BASED_OUTPATIENT_CLINIC_OR_DEPARTMENT_OTHER)
Admission: EM | Admit: 2022-07-02 | Discharge: 2022-07-02 | Disposition: A | Discharge status: Home / Self Care | Payer: Medicare Other | Attending: Emergency Medicine | Admitting: Emergency Medicine

## 2022-07-02 ENCOUNTER — Other Ambulatory Visit: Payer: Self-pay

## 2022-07-02 ENCOUNTER — Emergency Department (HOSPITAL_BASED_OUTPATIENT_CLINIC_OR_DEPARTMENT_OTHER): Payer: Medicare Other | Admitting: Radiology

## 2022-07-02 DIAGNOSIS — R531 Weakness: Secondary | ICD-10-CM | POA: Diagnosis not present

## 2022-07-02 DIAGNOSIS — Z7982 Long term (current) use of aspirin: Secondary | ICD-10-CM | POA: Diagnosis not present

## 2022-07-02 DIAGNOSIS — Z8673 Personal history of transient ischemic attack (TIA), and cerebral infarction without residual deficits: Secondary | ICD-10-CM | POA: Insufficient documentation

## 2022-07-02 HISTORY — DX: Transient cerebral ischemic attack, unspecified: G45.9

## 2022-07-02 LAB — BASIC METABOLIC PANEL
Anion gap: 7 (ref 5–15)
BUN: 22 mg/dL (ref 8–23)
CO2: 29 mmol/L (ref 22–32)
Calcium: 9.9 mg/dL (ref 8.9–10.3)
Chloride: 103 mmol/L (ref 98–111)
Creatinine, Ser: 1.05 mg/dL (ref 0.61–1.24)
GFR, Estimated: >60 mL/min (ref >60)
Glucose, Bld: 104 mg/dL — ABNORMAL HIGH (ref 70–99)
Potassium: 4.7 mmol/L (ref 3.5–5.1)
Sodium: 139 mmol/L (ref 135–145)

## 2022-07-02 LAB — URINALYSIS, ROUTINE W REFLEX MICROSCOPIC
Bilirubin Urine: NEGATIVE
Glucose, UA: NEGATIVE mg/dL
Hgb urine dipstick: NEGATIVE
Ketones, ur: NEGATIVE mg/dL
Leukocytes,Ua: NEGATIVE
Nitrite: NEGATIVE
Protein, ur: NEGATIVE mg/dL
Specific Gravity, Urine: 1.012 (ref 1.005–1.030)
pH: 6.5 (ref 5.0–8.0)

## 2022-07-02 LAB — CBC WITH DIFFERENTIAL/PLATELET
Abs Immature Granulocytes: 0.02 10*3/uL (ref 0.00–0.07)
Basophils Absolute: 0 10*3/uL (ref 0.0–0.1)
Basophils Relative: 0 %
Eosinophils Absolute: 0 10*3/uL (ref 0.0–0.5)
Eosinophils Relative: 1 %
HCT: 38.1 % — ABNORMAL LOW (ref 39.0–52.0)
Hemoglobin: 12.1 g/dL — ABNORMAL LOW (ref 13.0–17.0)
Immature Granulocytes: 0 %
Lymphocytes Relative: 28 %
Lymphs Abs: 2.1 10*3/uL (ref 0.7–4.0)
MCH: 29.6 pg (ref 26.0–34.0)
MCHC: 31.8 g/dL (ref 30.0–36.0)
MCV: 93.2 fL (ref 80.0–100.0)
Monocytes Absolute: 0.6 10*3/uL (ref 0.1–1.0)
Monocytes Relative: 9 %
Neutro Abs: 4.6 10*3/uL (ref 1.7–7.7)
Neutrophils Relative %: 62 %
Platelets: 244 10*3/uL (ref 150–400)
RBC: 4.09 MIL/uL — ABNORMAL LOW (ref 4.22–5.81)
RDW: 13.6 % (ref 11.5–15.5)
WBC: 7.4 10*3/uL (ref 4.0–10.5)
nRBC: 0 % (ref 0.0–0.2)

## 2022-07-02 NOTE — ED Provider Notes (Signed)
[facility] EMERGENCY DEPT Provider Note   CSN: [id number] Arrival date & time: 07/02/22  1157     History  No chief complaint on file.   Brandon Gray is a 88 y.o. male.  HPI Patient is here with his granddaughter.  Patient with feeling a little off.  Developed with that around 7 this morning.  States she just feels a little off but is hard to quantify.  States feels a little weak all over.  No coughing.  No trouble breathing.  No fevers.  No dysuria.  Did have a few months ago admission to the hospital for mental status change.  Thought to be potentially focal seizures.  Negative EEG at that time.  Also had a small stroke that showed up on MRI.  Those episodes were appear to be different than this 1.  Past Medical History:  Diagnosis Date   Hearing loss    Hyperlipidemia    Seizures (HCC)    TIA (transient ischemic attack)     Home Medications Prior to Admission medications   Medication Sig Start Date End Date Taking? Authorizing Provider  aspirin EC 81 MG EC tablet Take 1 tablet (81 mg total) by mouth daily. Swallow whole. 11/09/21   Zigmund Daniel., MD  lamoTRIgine (LAMICTAL) 200 MG tablet Take 1 tablet (200 mg total) by mouth 2 (two) times daily. 09/07/20   Ardith Dark, MD  melatonin 5 MG TABS Take 5 mg by mouth at bedtime.    [provider]  simvastatin (ZOCOR) 40 MG tablet Take 1 tablet (40 mg total) by mouth daily. 11/09/21 12/09/21  Zigmund Daniel., MD      Allergies    Patient has no known allergies.    Review of Systems   Review of Systems  Physical Exam Updated Vital Signs BP 137/63   Pulse 61   Temp 97.9 F (36.6 C) (Oral)   Resp 14   SpO2 99%  Physical Exam Vitals and nursing note reviewed.  Eyes:     Pupils: Pupils are equal, round, and reactive to light.  Cardiovascular:     Rate and Rhythm: Regular rhythm.  Pulmonary:     Breath sounds: No wheezing or rhonchi.  Abdominal:     Tenderness: There is  no abdominal tenderness.  Musculoskeletal:     Cervical back: Neck supple.  Neurological:     Mental Status: He is alert and oriented to person, place, and time.     Comments: Awake and appropriate but somewhat hard of hearing.  Moving all extremities.     ED Results / Procedures / Treatments   Labs (all labs ordered are listed, but only abnormal results are displayed) Labs Reviewed  CBC WITH DIFFERENTIAL/PLATELET - Abnormal; Notable for the following components:      Result Value   RBC 4.09 (*)    Hemoglobin 12.1 (*)    HCT 38.1 (*)    All other components within normal limits  BASIC METABOLIC PANEL - Abnormal; Notable for the following components:   Glucose, Bld 104 (*)    All other components within normal limits  URINALYSIS, ROUTINE W REFLEX MICROSCOPIC    EKG None  Radiology DG Chest 2 View  Result Date: 07/02/2022 CLINICAL DATA:  Weakness.  Anxiety. EXAM: CHEST - 2 VIEW COMPARISON:  One-view chest x-ray 08/29/2021. FINDINGS: Heart size exaggerated by low lung volumes. Loop recorder is in place. Mild bibasilar airspace disease present. Lungs are otherwise clear. No edema or  effusion is present. Visualized soft tissues and bony thorax are unremarkable. IMPRESSION: 1. Low lung volumes. 2. Mild bibasilar airspace disease likely reflects atelectasis. Electronically Signed   By: Marin Roberts M.D.   On: 07/02/2022 16:12    Procedures Procedures    Medications Ordered in ED Medications - No data to display  ED Course/ Medical Decision Making/ A&P                           Medical Decision Making Amount and/or Complexity of Data Reviewed Labs: ordered. Radiology: ordered.   Since he got up Patient felt a little.  Not lateralizing numbness or weakness but just generally little off.  Did have a stroke/seizures 8 months ago.  No episodes since.  Continue medications.  No fevers or chills.  No headache.  No confusion.  Differential diagnoses along.  Nonfocal exam.   Lungs clear.  Urine does not show infection.  Hemoglobin with a mild anemia that appears to be chronic.  Also glucose mildly elevated.  Nonfocal exam do not think I need head CT.  Monitor strip reviewed and is in sinus rhythm.  Appears stable for discharge home.  Patient's granddaughter is here and thinks it could be a component of anxiety.  Appears stable for discharge home with outpatient follow-up.        Final Clinical Impression(s) / ED Diagnoses Final diagnoses:  Weakness    Rx / DC Orders ED Discharge Orders     None         Benjiman Core, MD 07/02/22 1650

## 2022-07-02 NOTE — ED Triage Notes (Signed)
Pt feeling anxious today. Feels week and he can't really describe but just feels different. Started at 0800. Lives at [facility] and they checked vitals and they were WNL>

## 2022-07-02 NOTE — ED Triage Notes (Signed)
Had tia in last few months, has loop recorder to ascertain if he was having any irregular heart beats.

## 2022-07-02 NOTE — ED Notes (Signed)
Urine sent to lab 

## 2022-07-04 DIAGNOSIS — H900 Conductive hearing loss, bilateral: Secondary | ICD-10-CM | POA: Diagnosis not present

## 2022-07-04 DIAGNOSIS — R41841 Cognitive communication deficit: Secondary | ICD-10-CM | POA: Diagnosis not present

## 2022-07-04 DIAGNOSIS — R1314 Dysphagia, pharyngoesophageal phase: Secondary | ICD-10-CM | POA: Diagnosis not present

## 2022-07-11 DIAGNOSIS — H900 Conductive hearing loss, bilateral: Secondary | ICD-10-CM | POA: Diagnosis not present

## 2022-07-11 DIAGNOSIS — R41841 Cognitive communication deficit: Secondary | ICD-10-CM | POA: Diagnosis not present

## 2022-07-11 DIAGNOSIS — R1314 Dysphagia, pharyngoesophageal phase: Secondary | ICD-10-CM | POA: Diagnosis not present

## 2022-07-13 DIAGNOSIS — F5101 Primary insomnia: Secondary | ICD-10-CM | POA: Diagnosis not present

## 2022-07-13 DIAGNOSIS — M6281 Muscle weakness (generalized): Secondary | ICD-10-CM | POA: Diagnosis not present

## 2022-07-15 DIAGNOSIS — R1314 Dysphagia, pharyngoesophageal phase: Secondary | ICD-10-CM | POA: Diagnosis not present

## 2022-07-15 DIAGNOSIS — R41841 Cognitive communication deficit: Secondary | ICD-10-CM | POA: Diagnosis not present

## 2022-07-15 DIAGNOSIS — H900 Conductive hearing loss, bilateral: Secondary | ICD-10-CM | POA: Diagnosis not present

## 2022-07-18 DIAGNOSIS — H900 Conductive hearing loss, bilateral: Secondary | ICD-10-CM | POA: Diagnosis not present

## 2022-07-18 DIAGNOSIS — R1314 Dysphagia, pharyngoesophageal phase: Secondary | ICD-10-CM | POA: Diagnosis not present

## 2022-07-18 DIAGNOSIS — M6281 Muscle weakness (generalized): Secondary | ICD-10-CM | POA: Diagnosis not present

## 2022-07-18 DIAGNOSIS — R41841 Cognitive communication deficit: Secondary | ICD-10-CM | POA: Diagnosis not present

## 2022-07-20 DIAGNOSIS — M6281 Muscle weakness (generalized): Secondary | ICD-10-CM | POA: Diagnosis not present

## 2022-07-22 DIAGNOSIS — R1314 Dysphagia, pharyngoesophageal phase: Secondary | ICD-10-CM | POA: Diagnosis not present

## 2022-07-22 DIAGNOSIS — R41841 Cognitive communication deficit: Secondary | ICD-10-CM | POA: Diagnosis not present

## 2022-07-22 DIAGNOSIS — H900 Conductive hearing loss, bilateral: Secondary | ICD-10-CM | POA: Diagnosis not present

## 2022-07-25 DIAGNOSIS — R1314 Dysphagia, pharyngoesophageal phase: Secondary | ICD-10-CM | POA: Diagnosis not present

## 2022-07-25 DIAGNOSIS — R41841 Cognitive communication deficit: Secondary | ICD-10-CM | POA: Diagnosis not present

## 2022-07-25 DIAGNOSIS — H900 Conductive hearing loss, bilateral: Secondary | ICD-10-CM | POA: Diagnosis not present

## 2022-07-25 DIAGNOSIS — M6281 Muscle weakness (generalized): Secondary | ICD-10-CM | POA: Diagnosis not present

## 2022-07-28 ENCOUNTER — Encounter: Payer: Self-pay | Admitting: Family Medicine

## 2022-07-28 ENCOUNTER — Ambulatory Visit (INDEPENDENT_AMBULATORY_CARE_PROVIDER_SITE_OTHER): Payer: Medicare Other | Admitting: Family Medicine

## 2022-07-28 ENCOUNTER — Ambulatory Visit (INDEPENDENT_AMBULATORY_CARE_PROVIDER_SITE_OTHER): Payer: Medicare Other

## 2022-07-28 VITALS — BP 133/54 | HR 70 | Temp 98.0°F | Ht 64.0 in | Wt 145.8 lb

## 2022-07-28 DIAGNOSIS — R739 Hyperglycemia, unspecified: Secondary | ICD-10-CM | POA: Diagnosis not present

## 2022-07-28 DIAGNOSIS — I639 Cerebral infarction, unspecified: Secondary | ICD-10-CM

## 2022-07-28 DIAGNOSIS — Z0001 Encounter for general adult medical examination with abnormal findings: Secondary | ICD-10-CM | POA: Diagnosis not present

## 2022-07-28 DIAGNOSIS — G40909 Epilepsy, unspecified, not intractable, without status epilepticus: Secondary | ICD-10-CM

## 2022-07-28 DIAGNOSIS — R5381 Other malaise: Secondary | ICD-10-CM

## 2022-07-28 DIAGNOSIS — E785 Hyperlipidemia, unspecified: Secondary | ICD-10-CM

## 2022-07-28 DIAGNOSIS — G4700 Insomnia, unspecified: Secondary | ICD-10-CM

## 2022-07-28 LAB — COMPREHENSIVE METABOLIC PANEL
ALT: 14 U/L (ref 0–53)
AST: 17 U/L (ref 0–37)
Albumin: 4.4 g/dL (ref 3.5–5.2)
Alkaline Phosphatase: 73 U/L (ref 39–117)
BUN: 22 mg/dL (ref 6–23)
CO2: 33 mEq/L — ABNORMAL HIGH (ref 19–32)
Calcium: 9.8 mg/dL (ref 8.4–10.5)
Chloride: 102 mEq/L (ref 96–112)
Creatinine, Ser: 1.17 mg/dL (ref 0.40–1.50)
GFR: 54.21 mL/min — ABNORMAL LOW (ref >60.00)
Glucose, Bld: 107 mg/dL — ABNORMAL HIGH (ref 70–99)
Potassium: 4.6 mEq/L (ref 3.5–5.1)
Sodium: 139 mEq/L (ref 135–145)
Total Bilirubin: 0.4 mg/dL (ref 0.2–1.2)
Total Protein: 6.3 g/dL (ref 6.0–8.3)

## 2022-07-28 LAB — CBC
HCT: 36.8 % — ABNORMAL LOW (ref 39.0–52.0)
Hemoglobin: 12.6 g/dL — ABNORMAL LOW (ref 13.0–17.0)
MCHC: 34.2 g/dL (ref 30.0–36.0)
MCV: 90.5 fl (ref 78.0–100.0)
Platelets: 282 10*3/uL (ref 150.0–400.0)
RBC: 4.06 Mil/uL — ABNORMAL LOW (ref 4.22–5.81)
RDW: 13.8 % (ref 11.5–15.5)
WBC: 8.5 10*3/uL (ref 4.0–10.5)

## 2022-07-28 LAB — CUP PACEART REMOTE DEVICE CHECK
Date Time Interrogation Session: 20231214010600
Implantable Pulse Generator Implant Date: 20230327
Pulse Gen Serial Number: 177206

## 2022-07-28 LAB — LIPID PANEL
Cholesterol: 137 mg/dL (ref 0–200)
HDL: 59.3 mg/dL (ref >39.00)
LDL Cholesterol: 58 mg/dL (ref 0–99)
NonHDL: 77.54
Total CHOL/HDL Ratio: 2
Triglycerides: 99 mg/dL (ref 0.0–149.0)
VLDL: 19.8 mg/dL (ref 0.0–40.0)

## 2022-07-28 LAB — TSH: TSH: 3.14 u[IU]/mL (ref 0.35–5.50)

## 2022-07-28 LAB — HEMOGLOBIN A1C: Hgb A1c MFr Bld: 6 % (ref 4.6–6.5)

## 2022-07-28 MED ORDER — BELSOMRA 5 MG PO TABS
5.0000 mg | ORAL_TABLET | Freq: Every evening | ORAL | 5 refills | Status: DC | PRN
Start: 2022-07-28 — End: 2022-08-01

## 2022-07-28 NOTE — Assessment & Plan Note (Signed)
On simvastatin 40 mg daily.  Will check lipids today.  May need to increase this depending on results.

## 2022-07-28 NOTE — Assessment & Plan Note (Signed)
Follows with cardiology for this.  Currently on aspirin and statin.  Will be checking labs today.  May need to increase dose of statin depending on results of lipid panel.  Has followed with cardiology and has not seen any evidence of A-fib or other predisposing arrhythmias.

## 2022-07-28 NOTE — Assessment & Plan Note (Signed)
Has some lower extremity weakness and abnormal gait.  Potentially due to deconditioning though his recent stroke may be playing a role as well.  He has recently started with physical therapy.  He will let me know if this does not continue to improve.  Discussed importance of high-protein diet.

## 2022-07-28 NOTE — Assessment & Plan Note (Signed)
Having improvement with trazodone that was prescribed by NP at assisted living.  Will remove her medication list today.  Will try Belsomra 5 mg daily.  We did discuss trial of mirtazapine however they do not feel as if his appetite is an issue at this point.  We can consider going back to this at some point in the future if he does not have much response to Belsomra.  We discussed potential side effects of medication.  They will follow-up with me in a few weeks we can titrate the dose as needed.

## 2022-07-28 NOTE — Patient Instructions (Signed)
It was very nice to see you today!  We will check blood work today  I will send a new sleeping medication called Belsomra.  Please let me know how this is working for you in a few weeks.  Please continue working with physical therapy.  I will see back in 6 months.  Come back sooner if needed.  Take care, Dr Jimmey Ralph  PLEASE NOTE:  If you had any lab tests please let us know if you have not heard back within a few days. You may see your results on mychart before we have a chance to review them but we will give you a call once they are reviewed by [place]. If we ordered any referrals today, please let us know if you have not heard from their office within the next week.   Please try these tips to maintain a healthy lifestyle:  Eat at least 3 REAL meals and 1-2 snacks per day.  Aim for no more than 5 hours between eating.  If you eat breakfast, please do so within one hour of getting up.   Each meal should contain half fruits/vegetables, one quarter protein, and one quarter carbs (no bigger than a computer mouse)  Cut down on sweet beverages. This includes juice, soda, and sweet tea.   Drink at least 1 glass of water with each meal and aim for at least 8 glasses per day  Exercise at least 150 minutes every week.    Preventive Care 106 Years and Older, Male Preventive care refers to lifestyle choices and visits with your health care provider that can promote health and wellness. Preventive care visits are also called wellness exams. What can I expect for my preventive care visit? Counseling During your preventive care visit, your health care provider may ask about your: Medical history, including: Past medical problems. Family medical history. History of falls. Current health, including: Emotional well-being. Home life and relationship well-being. Sexual activity. Memory and ability to understand (cognition). Lifestyle, including: Alcohol, nicotine or tobacco, and drug use. Access to  firearms. Diet, exercise, and sleep habits. Work and work Astronomer. Sunscreen use. Safety issues such as seatbelt and bike helmet use. Physical exam Your health care provider will check your: Height and weight. These may be used to calculate your BMI (body mass index). BMI is a measurement that tells if you are at a healthy weight. Waist circumference. This measures the distance around your waistline. This measurement also tells if you are at a healthy weight and may help predict your risk of certain diseases, such as type 2 diabetes and high blood pressure. Heart rate and blood pressure. Body temperature. Skin for abnormal spots. What immunizations do I need?  Vaccines are usually given at various ages, according to a schedule. Your health care provider will recommend vaccines for you based on your age, medical history, and lifestyle or other factors, such as travel or where you work. What tests do I need? Screening Your health care provider may recommend screening tests for certain conditions. This may include: Lipid and cholesterol levels. Diabetes screening. This is done by checking your blood sugar (glucose) after you have not eaten for a while (fasting). Hepatitis C test. Hepatitis B test. HIV (human immunodeficiency virus) test. STI (sexually transmitted infection) testing, if you are at risk. Lung cancer screening. Colorectal cancer screening. Prostate cancer screening. Abdominal aortic aneurysm (AAA) screening. You may need this if you are a current or former smoker. Talk with your health care provider about  your test results, treatment options, and if necessary, the need for more tests. Follow these instructions at home: Eating and drinking  Eat a diet that includes fresh fruits and vegetables, whole grains, lean protein, and low-fat dairy products. Limit your intake of foods with high amounts of sugar, saturated fats, and salt. Take vitamin and mineral supplements as  recommended by your health care provider. Do not drink alcohol if your health care provider tells you not to drink. If you drink alcohol: Limit how much you have to 0-2 drinks a day. Know how much alcohol is in your drink. In the [place] one drink equals one 12 oz bottle of beer (355 mL), one 5 oz glass of wine (148 mL), or one 1 oz glass of hard liquor (44 mL). Lifestyle Brush your teeth every morning and night with fluoride toothpaste. Floss one time each day. Exercise for at least 30 minutes 5 or more days each week. Do not use any products that contain nicotine or tobacco. These products include cigarettes, chewing tobacco, and vaping devices, such as e-cigarettes. If you need help quitting, ask your health care provider. Do not use drugs. If you are sexually active, practice safe sex. Use a condom or other form of protection to prevent STIs. Take aspirin only as told by your health care provider. Make sure that you understand how much to take and what form to take. Work with your health care provider to find out whether it is safe and beneficial for you to take aspirin daily. Ask your health care provider if you need to take a cholesterol-lowering medicine (statin). Find healthy ways to manage stress, such as: Meditation, yoga, or listening to music. Journaling. Talking to a trusted person. Spending time with friends and family. Safety Always wear your seat belt while driving or riding in a vehicle. Do not drive: If you have been drinking alcohol. Do not ride with someone who has been drinking. When you are tired or distracted. While texting. If you have been using any mind-altering substances or drugs. Wear a helmet and other protective equipment during sports activities. If you have firearms in your house, make sure you follow all gun safety procedures. Minimize exposure to UV radiation to reduce your risk of skin cancer. What's next? Visit your health care provider once a year for  an annual wellness visit. Ask your health care provider how often you should have your eyes and teeth checked. Stay up to date on all vaccines. This information is not intended to replace advice given to you by your health care provider. Make sure you discuss any questions you have with your health care provider. Document Revised: 01/27/2021 Document Reviewed: 01/27/2021 Elsevier Patient Education  [employer].

## 2022-07-28 NOTE — Progress Notes (Signed)
Chief Complaint:  Brandon Gray is a 88 y.o. male who presents today for his annual comprehensive physical exam.    Assessment/Plan:  Chronic Problems Addressed Today: Stroke [facility]) Follows with cardiology for this.  Currently on aspirin and statin.  Will be checking labs today.  May need to increase dose of statin depending on results of lipid panel.  Has followed with cardiology and has not seen any evidence of A-fib or other predisposing arrhythmias.  Hyperlipidemia On simvastatin 40 mg daily.  Will check lipids today.  May need to increase this depending on results.  Debility Has some lower extremity weakness and abnormal gait.  Potentially due to deconditioning though his recent stroke may be playing a role as well.  He has recently started with physical therapy.  He will let me know if this does not continue to improve.  Discussed importance of high-protein diet.  Insomnia Having improvement with trazodone that was prescribed by NP at assisted living.  Will remove her medication list today.  Will try Belsomra 5 mg daily.  We did discuss trial of mirtazapine however they do not feel as if his appetite is an issue at this point.  We can consider going back to this at some point in the future if he does not have much response to Belsomra.  We discussed potential side effects of medication.  They will follow-up with me in a few weeks we can titrate the dose as needed.  Seizure disorder (HCC) On Lamictal 200 mg twice daily.  Preventative Healthcare: Check labs.  Up-to-date on vaccines.  No longer needs cancer screening due to age.  Patient Counseling(The following topics were reviewed and/or handout was given):  -Nutrition: Stressed importance of moderation in sodium/caffeine intake, saturated fat and cholesterol, caloric balance, sufficient intake of fresh fruits, vegetables, and fiber.  -Stressed the importance of regular exercise.   -Substance Abuse: Discussed  cessation/primary prevention of tobacco, alcohol, or other drug use; driving or other dangerous activities under the influence; availability of treatment for abuse.   -Injury prevention: Discussed safety belts, safety helmets, smoke detector, smoking near bedding or upholstery.   -Sexuality: Discussed sexually transmitted diseases, partner selection, use of condoms, avoidance of unintended pregnancy and contraceptive alternatives.   -Dental health: Discussed importance of regular tooth brushing, flossing, and dental visits.  -Health maintenance and immunizations reviewed. Please refer to Health maintenance section.  Return to care in 1 year for next preventative visit.     Subjective:  HPI:  He has no acute complaints today.   He has noticed more weakness the last several weeks.  In going to the ED due to concern for TIA.  Workup there was negative.  Was discharged home.  He has noticed more difficulty with gait and some discoordination.  Symptoms have been stable for the last couple of weeks.  Since his last visit he was diagnosed with a stroke.  Has been following with cardiology since then.  Had cardiac workup including loop monitor that which did not show any atrial fibrillation.  Simvastatin was increased to 40 mg daily.  He has also had some issues with insomnia.  Currently has been seeing the nurse practitioner as an assisted living facility.  Was started on trazodone however has not noticed much improvement.  Has more difficulty staying asleep and falling asleep.       07/28/2022   12:54 PM  Depression screen PHQ 2/9  Decreased Interest 0  Down, Depressed, Hopeless 0  PHQ - 2  Score 0    Health Maintenance Due  Topic Date Due   Medicare Annual Wellness (AWV)  02/05/2022     ROS: Per HPI, otherwise a complete review of systems was negative.   PMH:  The following were reviewed and entered/updated in epic: Past Medical History:  Diagnosis Date   Hearing loss     Hyperlipidemia    Seizures (HCC)    TIA (transient ischemic attack)    Patient Active Problem List   Diagnosis Date Noted   Debility 07/28/2022   Insomnia 07/28/2022   Carotid stenosis, asymptomatic 11/08/2021   Stroke (HCC) 11/07/2021   Sensorineural hearing loss, bilateral 11/06/2021   Seizure disorder (HCC) 09/07/2020   Hyperlipidemia 09/07/2020   Hearing loss 09/07/2020   Past Surgical History:  Procedure Laterality Date   LOOP RECORDER INSERTION N/A 11/08/2021   Procedure: LOOP RECORDER INSERTION;  Surgeon: Lanier Prude, MD;  Location: [facility];  Service: Cardiovascular;  Laterality: N/A;    Family History  Problem Relation Age of Onset   Diabetes Father    Cancer Daughter    Early death Daughter     Medications- reviewed and updated Current Outpatient Medications  Medication Sig Dispense Refill   aspirin EC 81 MG EC tablet Take 1 tablet (81 mg total) by mouth daily. Swallow whole. 30 tablet 11   lamoTRIgine (LAMICTAL) 200 MG tablet Take 1 tablet (200 mg total) by mouth 2 (two) times daily. 180 tablet 3   melatonin 5 MG TABS Take 5 mg by mouth at bedtime.     simvastatin (ZOCOR) 40 MG tablet Take 1 tablet (40 mg total) by mouth daily. 30 tablet 1   Suvorexant (BELSOMRA) 5 MG TABS Take 5 mg by mouth at bedtime as needed (insomnia). 30 tablet 5   No current facility-administered medications for this visit.    Allergies-reviewed and updated No Known Allergies  Social History   Socioeconomic History   Marital status: Widowed    Spouse name: Not on file   Number of children: Not on file   Years of education: Not on file   Highest education level: Not on file  Occupational History   Not on file  Tobacco Use   Smoking status: Never   Smokeless tobacco: Never  Vaping Use   Vaping Use: Never used  Substance and Sexual Activity   Alcohol use: Yes    Alcohol/week: 7.0 standard drinks of alcohol    Types: 7 Glasses of wine per week   Drug use:  Never   Sexual activity: Not Currently  Other Topics Concern   Not on file  Social History Narrative   Not on file   Social Determinants of Health   Financial Resource Strain: Low Risk  (02/05/2021)   Overall Financial Resource Strain (CARDIA)    Difficulty of Paying Living Expenses: Not hard at all  Food Insecurity: No Food Insecurity (02/05/2021)   Hunger Vital Sign    Worried About Running Out of Food in the Last Year: Never true    Ran Out of Food in the Last Year: Never true  Transportation Needs: No Transportation Needs (02/05/2021)   PRAPARE - Administrator, Civil Service (Medical): No    Lack of Transportation (Non-Medical): No  Physical Activity: Inactive (02/05/2021)   Exercise Vital Sign    Days of Exercise per Week: 0 days    Minutes of Exercise per Session: 0 min  Stress: No Stress Concern Present (02/05/2021)   [place]  Institute of Occupational Health - Occupational Stress Questionnaire    Feeling of Stress : Not at all  Social Connections: Socially Isolated (02/05/2021)   Social Connection and Isolation Panel [NHANES]    Frequency of Communication with Friends and Family: More than three times a week    Frequency of Social Gatherings with Friends and Family: Once a week    Attends Religious Services: Never    Database administrator or Organizations: No    Attends Banker Meetings: Never    Marital Status: Widowed        Objective:  Physical Exam: BP (!) 133/54   Pulse 70   Temp 98 F (36.7 C) (Temporal)   Ht 5\' 4"  (1.626 m)   Wt 145 lb 12.8 oz (66.1 kg)   SpO2 98%   BMI 25.03 kg/m   Body mass index is 25.03 kg/m. Wt Readings from Last 3 Encounters:  07/28/22 145 lb 12.8 oz (66.1 kg)  11/19/21 145 lb (65.8 kg)  11/09/21 154 lb 5.2 oz (70 kg)   Gen: NAD, resting comfortably HEENT: OP clear. No thyromegaly noted.  CV: RRR with no murmurs appreciated Pulm: NWOB, CTAB with no crackles, wheezes, or rhonchi GI: Normal bowel sounds  present. Soft, Nontender, Nondistended. MSK: no edema, cyanosis, or clubbing noted Skin: warm, dry Neuro: CN2-12 grossly intact. Strength 5/5 in upper and lower extremities. Reflexes symmetric and intact bilaterally.  Psych: Normal affect and thought content     Aniyha Tate M. 11/11/21, MD 07/28/2022 1:35 PM

## 2022-07-28 NOTE — Assessment & Plan Note (Signed)
On Lamictal 200 mg twice daily.

## 2022-07-29 ENCOUNTER — Telehealth: Payer: Self-pay | Admitting: Family Medicine

## 2022-07-29 DIAGNOSIS — M6281 Muscle weakness (generalized): Secondary | ICD-10-CM | POA: Diagnosis not present

## 2022-07-29 DIAGNOSIS — H900 Conductive hearing loss, bilateral: Secondary | ICD-10-CM | POA: Diagnosis not present

## 2022-07-29 DIAGNOSIS — R41841 Cognitive communication deficit: Secondary | ICD-10-CM | POA: Diagnosis not present

## 2022-07-29 DIAGNOSIS — R1314 Dysphagia, pharyngoesophageal phase: Secondary | ICD-10-CM | POA: Diagnosis not present

## 2022-07-29 NOTE — Telephone Encounter (Signed)
Patient's son Brandon Gray called re: Brandon Gray with [facility] Care Director at [facility] ph#  [phone]   Fax# [phone] has Brandon Gray's permission to send Keri at [facility] all of Patient's RX's so that Lorina Rabon can order the medication instead of the RX's being sent to a Pharmacy.  If any questions concerns Brandon Gray requests to be contacted at ph# [phone]

## 2022-08-01 ENCOUNTER — Other Ambulatory Visit: Payer: Self-pay | Admitting: *Deleted

## 2022-08-01 DIAGNOSIS — M6281 Muscle weakness (generalized): Secondary | ICD-10-CM | POA: Diagnosis not present

## 2022-08-01 MED ORDER — BELSOMRA 5 MG PO TABS: 5.0000 mg | ORAL_TABLET | Freq: Every evening | ORAL | 5 refills | Status: DC | PRN

## 2022-08-01 NOTE — Telephone Encounter (Signed)
Spoke with patient son and requesting Rx Belsomra fax to [facility] Fax# [phone], fax send

## 2022-08-02 NOTE — Progress Notes (Signed)
Please inform patient of the following:  Labs are all stable.  Do not need to make any changes to his treatment plan at this time.  We can recheck again in 6 to 12 months.

## 2022-08-03 DIAGNOSIS — M6281 Muscle weakness (generalized): Secondary | ICD-10-CM | POA: Diagnosis not present

## 2022-08-04 ENCOUNTER — Non-Acute Institutional Stay: Payer: Medicare Other | Admitting: Family Medicine

## 2022-08-04 ENCOUNTER — Encounter: Payer: Self-pay | Admitting: Family Medicine

## 2022-08-04 VITALS — BP 110/72 | HR 71 | Temp 98.1°F | Resp 18

## 2022-08-04 DIAGNOSIS — G4700 Insomnia, unspecified: Secondary | ICD-10-CM

## 2022-08-04 DIAGNOSIS — R1319 Other dysphagia: Secondary | ICD-10-CM

## 2022-08-04 NOTE — Progress Notes (Signed)
[facility] Palliative Care Consult Note Telephone: [phone]  Fax: [phone]   Date of encounter: 08/04/22 1:18 PM PATIENT NAME: Brandon Gray [address] [place] [address]   [phone] (home)  DOB: 1928-07-06 MRN: [id number] PRIMARY CARE PROVIDER:    Vivi Barrack, MD,  [address] [place] [address] [phone]  REFERRING PROVIDER:   Vivi Gray, [place] [address],  [place] [address] [phone]  RESPONSIBLE PARTY:    Contact Information     Name Relation Home Work Mobile   Brandon Gray [phone]  [phone]        I met face to face with patient in [facility]. Palliative Care was asked to follow this patient by consultation request of  Brandon Barrack, MD to address advance care planning and complex medical decision making. This is the initial visit.          ASSESSMENT, SYMPTOM MANAGEMENT AND PLAN / RECOMMENDATIONS:   Esophageal dysphagia Improved with use of safe swallow strategies, including use of applesauce, alternating sips and bites. Advised if pill is large and scored, it can be broken in half to aid swallow. Encouraged upright for all intake and for 30-60 minutes after.   Insomnia Having some improvement with Trazodone but unknown if there were other concerns sought to be addressed wth prescription by PCP of Belsomra. Continue to follow and evaluate.   Advised that lack of sleep and changes in weight up or down by 10 lbs can alter the dosage needed to control seizures.                                               Follow up Palliative Care Visit: Palliative care will continue to follow for complex medical decision making, advance care planning, and clarification of goals. Return 6-8 weeks or prn.    This visit was coded based on medical decision making (MDM).  PPS: 60%  HOSPICE ELIGIBILITY/DIAGNOSIS: TBD  Chief Complaint:  [place] received a referral to follow up with patient for chronic disease management in setting of hx of stroke and seizure.  Palliative Care is also following for advance directive planning and defining/refining goals of care.  HISTORY OF PRESENT ILLNESS:  Brandon Gray is a 88 y.o. year old male with hx of stroke, TIA and seizure.  He also has asymptomatic carotid stenosis, HLD, bilateral sensorineural hearing loss and globus pharyngeus, he has an implanted loop recorder.  He had a recent visit with ST to check on esophageal dysfunction and difficulty swallowing a pill.  Seen by psychiatric NP for insomnia at the facility who recommended to continue Trazodone 50 mg QHS along with Melatonin 5 mg nightly. He states this was working to help him sleep.  This was changed by PCP who wrote prescription for Belsomra which pt has not yet received.  He says when he has had a seizure in the past he doesn't feel well and the last one he just felt "off" and it was noted he had a stroke and seizure.  Denies pain, nausea/vomiting, dysuria, infections.  Last fall about a month ago. He states his balance is off.   Appetite is good.  He is missing the ability to cook and do his own laundry like he did when he lived in independent living.  He  does have family support from his son.    History obtained from review of EMR, discussion with facility staff and/or Brandon Gray.   Facility labs 11/08/21 CMP remarkable for low total protein 5.9 (albumin normal at 3.8), Cr 1.16 with eGFR 59. CBC remarkable for HCT slighlty low at 38.9% HGB A1c normal at 5.6%  Facility labs 03/03/22: CBC remarkable for HGB 13.1, HCT 38.8%, CMP remarkable for elevated anion gap 16.3, low globulin 1.9 and osmolality 282.9   I reviewed EMR for available labs, medications, imaging, studies and related documents.  Records reviewed and summarized above.   ROS General: NAD EYES: denies vision changes ENMT: endorses dysphagia with improved  swallow with use of apple sauce Cardiovascular: denies chest pain, denies DOE Pulmonary: denies cough, denies increased SOB Abdomen: endorses good appetite, denies constipation, endorses continence of bowel GU: denies dysuria, endorses continence of urine MSK:  denies increased weakness, has some unsteadiness of gait and last had a fall about a month ago with no signfiicant injury Skin: denies rashes or wounds Neurological: denies pain, endorses insomnia has not received Belsomra yet Psych: Endorses some difficulty with the loss of his independence Heme/lymph/immuno: denies bruises, abnormal bleeding  Physical Exam: Current and past weights: 11/09/21 weight 154 lbs 5.2 oz, weight 05/26/22 145.2 lbs Constitutional: NAD General: WNWD  EYES: anicteric sclera, lids intact, no discharge, some ptosis of left upper eyelid ENMT: Hard of hearing, oral mucous membranes moist, dentition intact CV: S1S2, occasionally irregular beat/rate controlled, no LE edema-wearing turbigrip socks Pulmonary: CTAB, no increased work of breathing, no cough, room air Abdomen: normo-active BS + 4 quadrants, soft and non tender, no ascites GU: deferred MSK: no sarcopenia, moves all extremities, ambulatory with rollator Skin: warm and dry, no rashes or wounds on visible skin Neuro:  no generalized weakness, noted issue with word finding occasionally Psych: non-anxious affect, A and O x 3 Hem/lymph/immuno: no widespread bruising  CURRENT PROBLEM LIST:  Patient Active Problem List   Diagnosis Date Noted   Debility 07/28/2022   Insomnia 07/28/2022   Carotid stenosis, asymptomatic 11/08/2021   Stroke ([place]) 11/07/2021   Sensorineural hearing loss, bilateral 11/06/2021   Seizure disorder ([place]) 09/07/2020   Hyperlipidemia 09/07/2020   Hearing loss 09/07/2020   PAST MEDICAL HISTORY:  Active Ambulatory Problems    Diagnosis Date Noted   Seizure disorder ([place]) 09/07/2020   Hyperlipidemia 09/07/2020   Hearing loss  09/07/2020   Sensorineural hearing loss, bilateral 11/06/2021   Stroke ([place]) 11/07/2021   Carotid stenosis, asymptomatic 11/08/2021   Debility 07/28/2022   Insomnia 07/28/2022   Resolved Ambulatory Problems    Diagnosis Date Noted   Seizure ([place]) 11/06/2021   Pure hypercholesterolemia 11/06/2021   History of TIA (transient ischemic attack) 11/06/2021   Past Medical History:  Diagnosis Date   Seizures ([place])    TIA (transient ischemic attack)    SOCIAL HX:  Social History   Tobacco Use   Smoking status: Never   Smokeless tobacco: Never  Substance Use Topics   Alcohol use: Yes    Alcohol/week: 7.0 standard drinks of alcohol    Types: 7 Glasses of wine per week   FAMILY HX:  Family History  Problem Relation Age of Onset   Diabetes Father    Cancer Daughter    Early death Daughter        Preferred Pharmacy: ALLERGIES: No Known Allergies   PERTINENT MEDICATIONS:  Outpatient Encounter Medications as of 08/04/2022  Medication Sig   aspirin EC 81 MG  EC tablet Take 1 tablet (81 mg total) by mouth daily. Swallow whole.   lamoTRIgine (LAMICTAL) 200 MG tablet Take 1 tablet (200 mg total) by mouth 2 (two) times daily.   melatonin 5 MG TABS Take 5 mg by mouth at bedtime.   simvastatin (ZOCOR) 40 MG tablet Take 1 tablet (40 mg total) by mouth daily.   Suvorexant (BELSOMRA) 5 MG TABS Take 5 mg by mouth at bedtime as needed (insomnia).   No facility-administered encounter medications on file as of 08/04/2022.     ------------------------------------------------------------------------------------------ Advance Care Planning/Goals of Care: Goals include to maximize quality of life and symptom management. Our advance care planning conversation included a discussion about:    Identification  of a healthcare agent-son Bruce CODE STATUS: Full code at present    Thank you for the opportunity to participate in the care of Brandon Gray.  The palliative care team will continue to  follow. Please call our office at [phone] if we can be of additional assistance.   Marijo Conception, FNP-C  COVID-19 PATIENT SCREENING TOOL Asked and negative response unless otherwise noted:  Have you had symptoms of covid, tested positive or been in contact with someone with symptoms/positive test in the past 5-10 days? unknown

## 2022-08-09 DIAGNOSIS — R1319 Other dysphagia: Secondary | ICD-10-CM | POA: Insufficient documentation

## 2022-08-09 NOTE — Progress Notes (Signed)
[place] [facility] Recorder  

## 2022-08-10 DIAGNOSIS — M6281 Muscle weakness (generalized): Secondary | ICD-10-CM | POA: Diagnosis not present

## 2022-08-11 DIAGNOSIS — Z7982 Long term (current) use of aspirin: Secondary | ICD-10-CM | POA: Diagnosis not present

## 2022-08-11 DIAGNOSIS — I8390 Asymptomatic varicose veins of unspecified lower extremity: Secondary | ICD-10-CM | POA: Diagnosis not present

## 2022-08-11 DIAGNOSIS — H9190 Unspecified hearing loss, unspecified ear: Secondary | ICD-10-CM | POA: Diagnosis not present

## 2022-08-11 DIAGNOSIS — E785 Hyperlipidemia, unspecified: Secondary | ICD-10-CM | POA: Diagnosis not present

## 2022-08-12 DIAGNOSIS — M6281 Muscle weakness (generalized): Secondary | ICD-10-CM | POA: Diagnosis not present

## 2022-08-17 ENCOUNTER — Other Ambulatory Visit: Payer: Self-pay | Admitting: *Deleted

## 2022-08-17 ENCOUNTER — Telehealth: Payer: Self-pay | Admitting: *Deleted

## 2022-08-17 DIAGNOSIS — M6281 Muscle weakness (generalized): Secondary | ICD-10-CM | POA: Diagnosis not present

## 2022-08-17 NOTE — Telephone Encounter (Signed)
Approved today Effective from 08/17/2022 through 08/18/2023. Pharmacy notified

## 2022-08-17 NOTE — Telephone Encounter (Signed)
PA Rx Belsonra 5mg  send to day   Key: [facility] - Rx #: [id number] Waiting for determination

## 2022-08-19 DIAGNOSIS — M6281 Muscle weakness (generalized): Secondary | ICD-10-CM | POA: Diagnosis not present

## 2022-08-19 NOTE — Progress Notes (Signed)
Carelink Summary Report / Loop Recorder 

## 2022-08-22 ENCOUNTER — Other Ambulatory Visit: Payer: Self-pay | Admitting: Family Medicine

## 2022-08-22 ENCOUNTER — Telehealth: Payer: Self-pay | Admitting: Family Medicine

## 2022-08-22 DIAGNOSIS — M6281 Muscle weakness (generalized): Secondary | ICD-10-CM | POA: Diagnosis not present

## 2022-08-22 NOTE — Telephone Encounter (Signed)
Pt's son is asking to have rx printed for  Suvorexant (BELSOMRA) 5 MG TABS   Please call once it is ready to be picked up

## 2022-08-22 NOTE — Telephone Encounter (Signed)
Type of form received: [place] assistance  Additional comments:   Received by: Sunnie Nielsen should be Faxed to:  Form should be mailed to:    Is patient requesting call for pickup: yes   Form placed:  In provider's box  Attach charge sheet. yes  Individual made aware of 3-5 business day turn around (Y/N)?  yes

## 2022-08-24 MED ORDER — BELSOMRA 5 MG PO TABS: 5.0000 mg | ORAL_TABLET | Freq: Every evening | ORAL | 5 refills | Status: DC | PRN

## 2022-08-25 NOTE — Telephone Encounter (Signed)
Form faxed

## 2022-08-26 DIAGNOSIS — M6281 Muscle weakness (generalized): Secondary | ICD-10-CM | POA: Diagnosis not present

## 2022-08-29 ENCOUNTER — Ambulatory Visit (INDEPENDENT_AMBULATORY_CARE_PROVIDER_SITE_OTHER): Payer: Medicare Other

## 2022-08-29 DIAGNOSIS — I639 Cerebral infarction, unspecified: Secondary | ICD-10-CM

## 2022-08-29 DIAGNOSIS — M6281 Muscle weakness (generalized): Secondary | ICD-10-CM | POA: Diagnosis not present

## 2022-08-31 LAB — CUP PACEART REMOTE DEVICE CHECK
Date Time Interrogation Session: 20240115011100
Implantable Pulse Generator Implant Date: 20230327
Pulse Gen Serial Number: 177206

## 2022-09-02 DIAGNOSIS — M6281 Muscle weakness (generalized): Secondary | ICD-10-CM | POA: Diagnosis not present

## 2022-09-29 ENCOUNTER — Ambulatory Visit (INDEPENDENT_AMBULATORY_CARE_PROVIDER_SITE_OTHER): Payer: Medicare Other

## 2022-09-29 DIAGNOSIS — I639 Cerebral infarction, unspecified: Secondary | ICD-10-CM

## 2022-09-30 LAB — CUP PACEART REMOTE DEVICE CHECK
Date Time Interrogation Session: 20240215001900
Implantable Pulse Generator Implant Date: 20230327
Pulse Gen Serial Number: 177206

## 2022-10-11 NOTE — Progress Notes (Signed)
[address] [employer]

## 2022-10-26 ENCOUNTER — Telehealth: Payer: Self-pay | Admitting: Family Medicine

## 2022-10-26 NOTE — Telephone Encounter (Signed)
Copied from [place] [id number]. Topic: Medicare AWV >> Oct 26, 2022 11:19 AM Gillis Santa wrote: Reason for CRM: Called patient to schedule Medicare Annual Wellness Visit (AWV). Left message for patient to call back and schedule Medicare Annual Wellness Visit (AWV).  Last date of AWV: N/A  Please schedule an appointment at any time with [place], [place]. Please scheduled AWVS with Otila Kluver, Health Coach, [place] [facility].  If any questions, please contact me at [phone].  Thank you ,  Shaune Pollack [facility] AWV TEAM Direct Dial [phone]

## 2022-10-27 NOTE — Progress Notes (Signed)
Carelink Summary Report / Loop Recorder 

## 2022-10-28 ENCOUNTER — Non-Acute Institutional Stay: Payer: Medicare Other | Admitting: Family Medicine

## 2022-10-28 VITALS — BP 136/82 | HR 62 | Temp 97.7°F | Resp 16

## 2022-10-28 DIAGNOSIS — N9089 Other specified noninflammatory disorders of vulva and perineum: Secondary | ICD-10-CM

## 2022-10-28 NOTE — Progress Notes (Unsigned)
[facility], [facility] Palliative Care Consult Note Telephone: [phone]  Fax: [phone]   Date of encounter: 10/28/22 2:48 PM PATIENT NAME: Brandon Gray [address] [place] [address]   [phone] (home)  DOB: 1928-07-07 MRN: [id number] PRIMARY CARE PROVIDER:    Vivi Barrack, MD,  [address] [place] [address] [phone]  REFERRING PROVIDER:   Vivi Barrack, [place],  [place] [address] [place] Agent/Health Care Power of Attorney:    Contact Information     Name Relation Home Work [place] Son [phone]  [phone]        I met face to face with patient in [facility] facility. Palliative Care was asked to follow this patient by consultation request of Vivi Barrack, MD to address advance care planning and complex medical decision making. This is a follow up visit.   CODE STATUS: Full Code   ASSESSMENT AND / RECOMMENDATIONS:  PPS:   Follow up Palliative Care Visit:  Palliative Care continuing to follow up by monitoring for changes in appetite, weight, functional and cognitive status for chronic disease progression and management in agreement with patient's stated goals of care. Next visit in *** weeks or prn.  This visit was coded based on medical decision making (MDM).  Chief Complaint *** HISTORY OF PRESENT ILLNESS: NAME@ is a 89 y.o. year old male with *** .    ACTIVITIES OF DAILY LIVING: CONTINENT OF BLADDER? Y/N CONTINENT OF BOWEL? Y/N  MOBILITY:   INDEPENDENTLY AMBULATORY?  Y/N   AMBULATORY WITH ASSISTIVE DEVICE: WHEELCHAIR/POWER CHAIR? BEDBOUND?  APPETITE?  CURRENT PROBLEM LIST:  Patient Active Problem List   Diagnosis Date Noted   Esophageal dysphagia 08/09/2022   Debility 07/28/2022   Insomnia 07/28/2022   Carotid stenosis, asymptomatic 11/08/2021   Stroke ([place]) 11/07/2021   Sensorineural hearing loss,  bilateral 11/06/2021   Seizure disorder ([place]) 09/07/2020   Hyperlipidemia 09/07/2020   Hearing loss 09/07/2020   PAST MEDICAL HISTORY:  Active Ambulatory Problems    Diagnosis Date Noted   Seizure disorder ([place]) 09/07/2020   Hyperlipidemia 09/07/2020   Hearing loss 09/07/2020   Sensorineural hearing loss, bilateral 11/06/2021   Stroke ([place]) 11/07/2021   Carotid stenosis, asymptomatic 11/08/2021   Debility 07/28/2022   Insomnia 07/28/2022   Esophageal dysphagia 08/09/2022   Resolved Ambulatory Problems    Diagnosis Date Noted   Seizure ([place]) 11/06/2021   Pure hypercholesterolemia 11/06/2021   History of TIA (transient ischemic attack) 11/06/2021   Past Medical History:  Diagnosis Date   Seizures ([place])    TIA (transient ischemic attack)    SOCIAL HX:  Social History   Tobacco Use   Smoking status: Never   Smokeless tobacco: Never  Substance Use Topics   Alcohol use: Yes    Alcohol/week: 7.0 standard drinks of alcohol    Types: 7 Glasses of wine per week   FAMILY HX:  Family History  Problem Relation Age of Onset   Diabetes Father    Cancer Daughter    Early death Daughter     ***   Preferred Pharmacy: ALLERGIES: No Known Allergies   PERTINENT MEDICATIONS:  Outpatient Encounter Medications as of 10/28/2022  Medication Sig   aspirin EC 81 MG EC tablet Take 1 tablet (81 mg total) by mouth daily. Swallow whole.   lamoTRIgine (LAMICTAL) 200 MG tablet Take 1 tablet (200 mg total) by mouth 2 (two) times daily.  melatonin 5 MG TABS Take 5 mg by mouth at bedtime.   simvastatin (ZOCOR) 40 MG tablet Take 1 tablet (40 mg total) by mouth daily.   Suvorexant (BELSOMRA) 5 MG TABS Take 5 mg by mouth at bedtime as needed (insomnia).   No facility-administered encounter medications on file as of 10/28/2022.    History obtained from review of EMR, discussion with primary team, and interview with family, facility staff/caregiver and/or patient.     I reviewed available  labs, medications, imaging, studies and related documents from the EMR.  There were no new records/imaging since last visit. Records reviewed and summarized above.   Physical Exam: GENERAL: NAD HEENT: Approx 7 mm scabbed lesion on left lower lip left of center LUNGS: CTAB, no increased work of breathing, room air CARDIAC:  S1S2, RRR with no MRG, Y/N edema, Y/N cyanosis ABD:  Normo-active BS x 4 quads, soft, non-tender EXTREMITIES: Normal ROM, no deformity, strength equal, Y/N muscle atrophy/subcutaneous fat loss NEURO:  No weakness or cognitive impairment, aphasia-expressive/receptive  PSYCH:  non-anxious affect, A & O x 3  Thank you for the opportunity to participate in the care of Brandon Gray. Please call our main office at [phone] if we can be of additional assistance.    Damaris Hippo FNP-C  [email or web address] .[facility]  Phone:  [phone]

## 2022-10-31 ENCOUNTER — Encounter: Payer: Self-pay | Admitting: Family Medicine

## 2022-10-31 DIAGNOSIS — N9089 Other specified noninflammatory disorders of vulva and perineum: Secondary | ICD-10-CM | POA: Insufficient documentation

## 2022-11-01 DIAGNOSIS — H9190 Unspecified hearing loss, unspecified ear: Secondary | ICD-10-CM | POA: Diagnosis not present

## 2022-11-01 DIAGNOSIS — K130 Diseases of lips: Secondary | ICD-10-CM | POA: Diagnosis not present

## 2022-11-02 DIAGNOSIS — D1801 Hemangioma of skin and subcutaneous tissue: Secondary | ICD-10-CM | POA: Diagnosis not present

## 2022-11-16 ENCOUNTER — Telehealth: Payer: Self-pay | Admitting: Family Medicine

## 2022-11-16 NOTE — Telephone Encounter (Signed)
Copied from [place] [id number]. Topic: Medicare AWV >> Nov 16, 2022 10:50 AM Gillis Santa wrote: Reason for CRM: Called patient to schedule Medicare Annual Wellness Visit (AWV). Left message for patient to call back and schedule Medicare Annual Wellness Visit (AWV).  Last date of AWV: 02/05/2021  Please schedule an appointment at any time with Otila Kluver, [facility]. Please schedule AWVS with Otila Kluver, [place]..  If any questions, please contact me at [phone].  Thank you ,  Shaune Pollack [facility] AWV TEAM Direct Dial [phone]

## 2022-11-22 ENCOUNTER — Ambulatory Visit: Payer: Medicare Other | Admitting: Family Medicine

## 2022-12-09 ENCOUNTER — Ambulatory Visit (INDEPENDENT_AMBULATORY_CARE_PROVIDER_SITE_OTHER): Payer: Medicare Other

## 2022-12-09 DIAGNOSIS — I639 Cerebral infarction, unspecified: Secondary | ICD-10-CM | POA: Diagnosis not present

## 2022-12-09 LAB — CUP PACEART REMOTE DEVICE CHECK
Date Time Interrogation Session: 20240426133400
Implantable Pulse Generator Implant Date: 20230327
Pulse Gen Serial Number: 177206

## 2022-12-27 ENCOUNTER — Telehealth: Payer: Self-pay | Admitting: Family Medicine

## 2022-12-27 NOTE — Telephone Encounter (Signed)
Copied from CRM [id number]. Topic: Medicare AWV >> Dec 27, 2022 10:07 AM Gwenith Spitz wrote: Reason for CRM: Called patient to schedule Medicare Annual Wellness Visit (AWV). Left message for patient to call back and schedule Medicare Annual Wellness Visit (AWV).  Last date of AWV: 02/05/2021  Please schedule an appointment at any time with Inetta Fermo, [facility]. Please schedule AWVS with Inetta Fermo, [facility].  If any questions, please contact me at [phone].  Thank you ,  Gabriel Cirri [facility] AWV TEAM Direct Dial [phone]

## 2023-01-03 NOTE — Progress Notes (Signed)
Carelink Summary Report / Loop Recorder 

## 2023-01-10 ENCOUNTER — Ambulatory Visit (INDEPENDENT_AMBULATORY_CARE_PROVIDER_SITE_OTHER): Payer: Medicare Other

## 2023-01-10 DIAGNOSIS — I639 Cerebral infarction, unspecified: Secondary | ICD-10-CM

## 2023-01-10 LAB — CUP PACEART REMOTE DEVICE CHECK
Date Time Interrogation Session: 20240527003900
Implantable Pulse Generator Implant Date: 20230327
Pulse Gen Serial Number: 177206

## 2023-01-27 ENCOUNTER — Encounter: Payer: Self-pay | Admitting: Family Medicine

## 2023-01-27 ENCOUNTER — Non-Acute Institutional Stay: Payer: Medicare Other | Admitting: Family Medicine

## 2023-01-27 VITALS — BP 134/72 | HR 67 | Temp 97.6°F | Resp 16

## 2023-01-27 DIAGNOSIS — R5381 Other malaise: Secondary | ICD-10-CM

## 2023-01-27 NOTE — Progress Notes (Signed)
Therapist, nutritional Palliative Care Consult Note Telephone: [phone]  Fax: [phone]   Date of encounter: 10/28/22 2:48 PM PATIENT NAME: Brandon Gray [address] [place] [address]   [phone] (home)  DOB: 06-06-1928 MRN: [id number] PRIMARY CARE PROVIDER:    Ardith Dark, MD,  [address] [place] [address] [phone]  REFERRING PROVIDER:   Ardith Dark, MD [address] [place],  [place] [address] [phone]  Health Care Agent: son  Contact Information     Name Relation Home Work Mobile   Asan, [place] [phone]  [phone]        I met face to face with patient in [facility] facility. Palliative Care was asked to follow this patient by consultation request of Ardith Dark, MD to address advance care planning and complex medical decision making. This is a follow up visit.   CODE STATUS: Full Code   ASSESSMENT AND / RECOMMENDATIONS:  PPS:  70%   Debility Is doing HEP as has been taught previously using the kitchen counter to maintain balance. Encouraged to have good intake of fluids. Encouraged to use rollator if feeling weaker and to alert staff if increasing in frequency, accompanied by  numbness or with falls. Continue to use HEP to maintain strength.   Follow up Palliative Care Visit:  Palliative Care continuing to follow up by monitoring for changes in appetite, weight, functional and cognitive status for chronic disease progression and management in agreement with patient's stated goals of care. Next visit in 6-8 weeks or prn.  This visit was coded based on medical decision making (MDM).  Chief Complaint  Palliative Care is continuing to follow pt for chronic medical management in setting of stroke history.  HISTORY OF PRESENT ILLNESS: Brandon Gray is a 89 y.o. year old male with history of stroke.  He states he has noted that at times his BP will run  lower and at times in 140s but was told by [facility] doctor that this was not a problem.  He states he knows he has one leg that is stronger than the other because he notices he drags it a little bit.  He has had some episodes in recent past where he is walking and feels like his legs are going to give out on him.  He stops to rest and this is usually very self limited and he is able to walk again.  He states he keeps his walker close by.  States occasional numbness, no loss of bowel/bladder control, no falls. He denies any cramping.  States he eats well but cannot eat 3 meals per day like his son encourages him to do.  Pt has not had any weight loss or loss of visible muscle mass.  He remains active and mostly independent in his ADLs.  His mood is good.   ACTIVITIES OF DAILY LIVING: CONTINENT OF BLADDER/ BOWEL? Yes  MOBILITY:   INDEPENDENTLY AMBULATORY?  Yes, at times will use rollator  APPETITE? Good but doesn't eat 3 meals daily  CURRENT PROBLEM LIST:  Patient Active Problem List   Diagnosis Date Noted   Esophageal dysphagia 08/09/2022   Debility 07/28/2022   Insomnia 07/28/2022   Carotid stenosis, asymptomatic 11/08/2021   Stroke ([facility]) 11/07/2021   Sensorineural hearing loss, bilateral 11/06/2021   Seizure disorder ([facility]) 09/07/2020   Hyperlipidemia 09/07/2020   Hearing loss 09/07/2020   PAST MEDICAL HISTORY:  Active Ambulatory Problems    Diagnosis Date Noted  Seizure disorder ([facility]) 09/07/2020   Hyperlipidemia 09/07/2020   Hearing loss 09/07/2020   Sensorineural hearing loss, bilateral 11/06/2021   Stroke ([facility]) 11/07/2021   Carotid stenosis, asymptomatic 11/08/2021   Debility 07/28/2022   Insomnia 07/28/2022   Esophageal dysphagia 08/09/2022   Resolved Ambulatory Problems    Diagnosis Date Noted   Seizure (H[facility]) 11/06/2021   Pure hypercholesterolemia 11/06/2021   History of TIA (transient ischemic attack) 11/06/2021   Past Medical History:  Diagnosis Date   Seizures (HCC)     TIA (transient ischemic attack)    SOCIAL HX:  Social History   Tobacco Use   Smoking status: Never   Smokeless tobacco: Never  Substance Use Topics   Alcohol use: Yes    Alcohol/week: 7.0 standard drinks of alcohol    Types: 7 Glasses of wine per week   FAMILY HX:  Family History  Problem Relation Age of Onset   Diabetes Father    Cancer Daughter    Early death Daughter        Preferred Pharmacy: ALLERGIES: No Known Allergies   PERTINENT MEDICATIONS:  Outpatient Encounter Medications as of 10/28/2022  Medication Sig   aspirin EC 81 MG EC tablet Take 1 tablet (81 mg total) by mouth daily. Swallow whole.   lamoTRIgine (LAMICTAL) 200 MG tablet Take 1 tablet (200 mg total) by mouth 2 (two) times daily.   melatonin 5 MG TABS Take 5 mg by mouth at bedtime.   simvastatin (ZOCOR) 40 MG tablet Take 1 tablet (40 mg total) by mouth daily.   Suvorexant (BELSOMRA) 5 MG TABS Take 5 mg by mouth at bedtime as needed (insomnia).   No facility-administered encounter medications on file as of 10/28/2022.    History obtained from review of EMR, discussion with patient.     I reviewed available labs, medications, imaging, studies and related documents from the EMR.  There were no new records/imaging since last visit.   Physical Exam: GENERAL: NAD HEENT:  HOH even with hearing aid LUNGS: CTAB, no increased work of breathing, room air CARDIAC:  S1S2, RRR with no MRG, No edema/cyanosis ABD:  Normo-active BS x 4 quads, soft, non-tender EXTREMITIES: Normal ROM, no deformity, strength equal, No muscle atrophy/subcutaneous fat loss NEURO:  No weakness or cognitive impairment PSYCH:  non-anxious affect, A & O x 3  Thank you for the opportunity to participate in the care of Brandon Gray. Please call our main office at [phone] if we can be of additional assistance.    Joycelyn Man FNP-C  [email or web address] .[facility]  Phone:   [phone]

## 2023-02-03 NOTE — Progress Notes (Signed)
[facility] [address] [employer] 

## 2023-02-09 ENCOUNTER — Ambulatory Visit (INDEPENDENT_AMBULATORY_CARE_PROVIDER_SITE_OTHER): Payer: Medicare Other

## 2023-02-09 DIAGNOSIS — I639 Cerebral infarction, unspecified: Secondary | ICD-10-CM

## 2023-02-09 LAB — CUP PACEART REMOTE DEVICE CHECK
Date Time Interrogation Session: 20240627004300
Implantable Pulse Generator Implant Date: 20230327
Pulse Gen Serial Number: 177206

## 2023-02-23 DIAGNOSIS — I6522 Occlusion and stenosis of left carotid artery: Secondary | ICD-10-CM | POA: Diagnosis not present

## 2023-02-23 DIAGNOSIS — G40909 Epilepsy, unspecified, not intractable, without status epilepticus: Secondary | ICD-10-CM | POA: Diagnosis not present

## 2023-02-23 DIAGNOSIS — N183 Chronic kidney disease, stage 3 unspecified: Secondary | ICD-10-CM | POA: Diagnosis not present

## 2023-02-23 DIAGNOSIS — R269 Unspecified abnormalities of gait and mobility: Secondary | ICD-10-CM | POA: Diagnosis not present

## 2023-02-28 DIAGNOSIS — I10 Essential (primary) hypertension: Secondary | ICD-10-CM | POA: Diagnosis not present

## 2023-02-28 DIAGNOSIS — E119 Type 2 diabetes mellitus without complications: Secondary | ICD-10-CM | POA: Diagnosis not present

## 2023-02-28 DIAGNOSIS — E559 Vitamin D deficiency, unspecified: Secondary | ICD-10-CM | POA: Diagnosis not present

## 2023-02-28 NOTE — Progress Notes (Signed)
 [facility] Summary Report / [facility] 

## 2023-03-06 DIAGNOSIS — H532 Diplopia: Secondary | ICD-10-CM | POA: Diagnosis not present

## 2023-03-07 DIAGNOSIS — N183 Chronic kidney disease, stage 3 unspecified: Secondary | ICD-10-CM | POA: Diagnosis not present

## 2023-03-07 DIAGNOSIS — G4700 Insomnia, unspecified: Secondary | ICD-10-CM | POA: Diagnosis not present

## 2023-03-07 DIAGNOSIS — E785 Hyperlipidemia, unspecified: Secondary | ICD-10-CM | POA: Diagnosis not present

## 2023-03-07 DIAGNOSIS — G40909 Epilepsy, unspecified, not intractable, without status epilepticus: Secondary | ICD-10-CM | POA: Diagnosis not present

## 2023-03-08 DIAGNOSIS — F5101 Primary insomnia: Secondary | ICD-10-CM | POA: Diagnosis not present

## 2023-03-13 ENCOUNTER — Ambulatory Visit (INDEPENDENT_AMBULATORY_CARE_PROVIDER_SITE_OTHER): Payer: Medicare Other

## 2023-03-13 DIAGNOSIS — I639 Cerebral infarction, unspecified: Secondary | ICD-10-CM | POA: Diagnosis not present

## 2023-03-13 LAB — CUP PACEART REMOTE DEVICE CHECK
Date Time Interrogation Session: 20240729014700
Implantable Pulse Generator Implant Date: 20230327
Pulse Gen Serial Number: 177206

## 2023-03-14 DIAGNOSIS — Z91138 Patient's unintentional underdosing of medication regimen for other reason: Secondary | ICD-10-CM | POA: Diagnosis not present

## 2023-03-14 DIAGNOSIS — Z9189 Other specified personal risk factors, not elsewhere classified: Secondary | ICD-10-CM | POA: Diagnosis not present

## 2023-03-14 DIAGNOSIS — G4700 Insomnia, unspecified: Secondary | ICD-10-CM | POA: Diagnosis not present

## 2023-03-22 ENCOUNTER — Encounter: Payer: Self-pay | Admitting: Family Medicine

## 2023-03-22 ENCOUNTER — Ambulatory Visit (INDEPENDENT_AMBULATORY_CARE_PROVIDER_SITE_OTHER): Payer: Medicare Other | Admitting: Family Medicine

## 2023-03-22 ENCOUNTER — Telehealth: Payer: Self-pay | Admitting: Family Medicine

## 2023-03-22 VITALS — BP 164/72 | HR 71 | Temp 97.1°F | Ht 64.0 in | Wt 141.2 lb

## 2023-03-22 DIAGNOSIS — I639 Cerebral infarction, unspecified: Secondary | ICD-10-CM

## 2023-03-22 DIAGNOSIS — R7303 Prediabetes: Secondary | ICD-10-CM

## 2023-03-22 DIAGNOSIS — E785 Hyperlipidemia, unspecified: Secondary | ICD-10-CM

## 2023-03-22 DIAGNOSIS — G4700 Insomnia, unspecified: Secondary | ICD-10-CM | POA: Diagnosis not present

## 2023-03-22 DIAGNOSIS — G40909 Epilepsy, unspecified, not intractable, without status epilepticus: Secondary | ICD-10-CM

## 2023-03-22 LAB — POCT GLYCOSYLATED HEMOGLOBIN (HGB A1C): Hemoglobin A1C: 5.2 % (ref 4.0–5.6)

## 2023-03-22 MED ORDER — BELSOMRA 10 MG PO TABS: 10.0000 mg | ORAL_TABLET | Freq: Every evening | ORAL | 5 refills | Status: DC | PRN

## 2023-03-22 MED ORDER — SIMVASTATIN 40 MG PO TABS: 40.0000 mg | ORAL_TABLET | Freq: Every day | ORAL | 3 refills | Status: AC

## 2023-03-22 MED ORDER — ASPIRIN 81 MG PO TBEC: 81.0000 mg | DELAYED_RELEASE_TABLET | Freq: Every day | ORAL | 11 refills | Status: DC

## 2023-03-22 MED ORDER — ASPIRIN 81 MG PO TBEC: 81.0000 mg | DELAYED_RELEASE_TABLET | Freq: Every day | ORAL | 11 refills | Status: AC

## 2023-03-22 MED ORDER — LAMOTRIGINE 200 MG PO TABS: 200.0000 mg | ORAL_TABLET | Freq: Two times a day (BID) | ORAL | 3 refills | Status: AC

## 2023-03-22 MED ORDER — SIMVASTATIN 40 MG PO TABS: 40.0000 mg | ORAL_TABLET | Freq: Every day | ORAL | 3 refills | Status: DC

## 2023-03-22 MED ORDER — LAMOTRIGINE 200 MG PO TABS: 200.0000 mg | ORAL_TABLET | Freq: Two times a day (BID) | ORAL | 3 refills | Status: DC

## 2023-03-22 NOTE — Telephone Encounter (Signed)
LVM cancelled order, patient requested  Rx hard copy, will used preferred pharmacy

## 2023-03-22 NOTE — Telephone Encounter (Signed)
[facility]Nya from [facility] called in regards to patient's Bbelsomra. States they have him taking 5 mg on file but received rx order for 10 mg. States they also wanted to see if this was suppose to be prn or nightly. RaNya can be reached directly @ [phone].

## 2023-03-22 NOTE — Assessment & Plan Note (Signed)
I had a lengthy discussion with patient and son today regarding treatment for his insomnia.  Trazodone has not been effective.  This was started at NP at his facility.  Will take this off of his medication list.  Will increase Belsomra to 10 mg daily.  They will follow-up with [place] in a few weeks via MyChart.  We can adjust the dose as tolerated.

## 2023-03-22 NOTE — Assessment & Plan Note (Signed)
A1c 5.4.  Recheck 6 to 12 months.

## 2023-03-22 NOTE — Assessment & Plan Note (Signed)
Discussed that he needs to continue with statin and aspirin due to history of strokes.  Will send prescription in for this today.  He was previously on simvastatin 40 mg daily.  Will restart this and recheck lipids in 3 to 6 months.  He has been following with cardiology and has a loop monitor in place.  Has not had any evidence of atrial fibrillation.  Advised him to follow-up with cardiology soon to discuss need for ongoing monitoring at this point.

## 2023-03-22 NOTE — Progress Notes (Signed)
   Brandon Gray is a 89 y.o. male who presents today for an office visit.  Assessment/Plan:  New/Acute Problems: Fall Seems mechanical in nature though he does have history of stroke and debility.  We did discuss physical therapy however they deferred for now.  His pain has  improved significantly-do not think we need to obtain any sort of imaging or further workup for this at this point.  Chronic Problems Addressed Today: Prediabetes A1c 5.4.  Recheck 6 to 12 months.  Insomnia I had a lengthy discussion with patient and son today regarding treatment for his insomnia.  Trazodone has not been effective.  This was started at NP at his facility.  Will take this off of his medication list.  Will increase Belsomra to 10 mg daily.  They will follow-up with [place] in a few weeks via MyChart.  We can adjust the dose as tolerated.  Seizure disorder (HCC) No recurren, seizures.  On Lamictal 200 mg twice daily.  Will refill today.  Hyperlipidemia Unclear why his statin was discontinued.  Will restart simvastatin 40 mg daily.  We can recheck lipids in 6 months.  Stroke [facility]) Discussed that he needs to continue with statin and aspirin due to history of strokes.  Will send prescription in for this today.  He was previously on simvastatin 40 mg daily.  Will restart this and recheck lipids in 3 to 6 months.  He has been following with cardiology and has a loop monitor in place.  Has not had any evidence of atrial fibrillation.  Advised him to follow-up with cardiology soon to discuss need for ongoing monitoring at this point.     Subjective:  HPI:  See Assessment / plan for status of chronic conditions.  He is here today with his son.  Doing well.  They are concerned that the nurse practitioner at his assisted living facility is prescribing medications and changing medications without consulting the rest of his care team.  He has not been on his statin or aspirin.  Was also restarted back on  trazodone 50 mg nightly.  He does not think the trazodone has been effective.  He is still taking his Belsomra as well.  He did fall couple of weeks ago.  Slipped while trying to grab the handle.  Landed on his buttocks.  Has some mild pain and bruising to the area.  This has resolved.  He is able to ambulate normally.  No persistent pain.       Objective:  Physical Exam: BP (!) 164/72   Pulse 71   Temp (!) 97.1 F (36.2 C) (Temporal)   Ht 5\' 4"  (1.626 m)   Wt 141 lb 3.2 oz (64 kg)   SpO2 95%   BMI 24.24 kg/m   Gen: No acute distress, resting comfortably CV: Regular rate and rhythm with no murmurs appreciated Pulm: Normal work of breathing, clear to auscultation bilaterally with no crackles, wheezes, or rhonchi Neuro: Grossly normal, moves all extremities Psych: Normal affect and thought content      Maricia Scotti M. Jimmey Ralph, MD 03/22/2023 9:02 AM

## 2023-03-22 NOTE — Patient Instructions (Signed)
It was very nice to see you today!  We will increase your Belsomra to 10 mg nightly.  Please send me a message in a few weeks let me know how you are doing with this.  Will see back in 6 months.  We will refill your other medications today.  Please call to schedule appoint with cardiology soon.  No follow-ups on file.   Take care, Dr Jimmey Ralph  PLEASE NOTE:  If you had any lab tests, please let us know if you have not heard back within a few days. You may see your results on mychart before we have a chance to review them but we will give you a call once they are reviewed by [place].   If we ordered any referrals today, please let us know if you have not heard from their office within the next week.   If you had any urgent prescriptions sent in today, please check with the pharmacy within an hour of our visit to make sure the prescription was transmitted appropriately.   Please try these tips to maintain a healthy lifestyle:  Eat at least 3 REAL meals and 1-2 snacks per day.  Aim for no more than 5 hours between eating.  If you eat breakfast, please do so within one hour of getting up.   Each meal should contain half fruits/vegetables, one quarter protein, and one quarter carbs (no bigger than a computer mouse)  Cut down on sweet beverages. This includes juice, soda, and sweet tea.   Drink at least 1 glass of water with each meal and aim for at least 8 glasses per day  Exercise at least 150 minutes every week.

## 2023-03-22 NOTE — Assessment & Plan Note (Signed)
Unclear why his statin was discontinued.  Will restart simvastatin 40 mg daily.  We can recheck lipids in 6 months.

## 2023-03-22 NOTE — Assessment & Plan Note (Addendum)
No recurren, seizures.  On Lamictal 200 mg twice daily.  Will refill today.

## 2023-03-29 NOTE — Progress Notes (Signed)
[facility] [address] [employer] 

## 2023-04-03 ENCOUNTER — Encounter: Payer: Self-pay | Admitting: Family Medicine

## 2023-04-05 DIAGNOSIS — F5101 Primary insomnia: Secondary | ICD-10-CM | POA: Diagnosis not present

## 2023-04-13 ENCOUNTER — Ambulatory Visit (INDEPENDENT_AMBULATORY_CARE_PROVIDER_SITE_OTHER): Payer: Medicare Other

## 2023-04-13 DIAGNOSIS — I639 Cerebral infarction, unspecified: Secondary | ICD-10-CM | POA: Diagnosis not present

## 2023-04-13 LAB — CUP PACEART REMOTE DEVICE CHECK
Date Time Interrogation Session: 20240829015200
Implantable Pulse Generator Implant Date: 20230327
Pulse Gen Serial Number: 177206

## 2023-04-14 ENCOUNTER — Ambulatory Visit: Payer: Medicare Other | Admitting: Family Medicine

## 2023-04-20 NOTE — Progress Notes (Signed)
Carelink Summary Report / Loop Recorder 

## 2023-04-26 ENCOUNTER — Telehealth: Payer: Self-pay | Admitting: Family Medicine

## 2023-04-26 NOTE — Telephone Encounter (Signed)
Caller was [employer], Pharmacologist from [facility].   She states patient's son called stating that he noticed trazodone was still on patient's med list despite him no longer taking this medication. Caller states they never got an order of discontinuation for the trazodone. Caller would like a call back to confirm whether or not this med is current. Caller can be reached @ [phone] and the discontinuation can be faxed to [phone].

## 2023-04-28 NOTE — Telephone Encounter (Signed)
Please advise 

## 2023-04-28 NOTE — Telephone Encounter (Signed)
He should not be on trazodone.  Katina Degree. Jimmey Ralph, MD 04/28/2023 10:03 AM

## 2023-05-01 NOTE — Telephone Encounter (Signed)
Call pharmacy at  [phone] notified patient not longer taking Trazodone

## 2023-05-01 NOTE — Telephone Encounter (Signed)
Brandon [facility] with [facility] requests to be called at ph# [phone]  re: Whether Patient should be taking Trazodone or not

## 2023-05-08 ENCOUNTER — Encounter: Payer: Self-pay | Admitting: Family Medicine

## 2023-05-09 ENCOUNTER — Other Ambulatory Visit: Payer: Self-pay | Admitting: *Deleted

## 2023-05-09 MED ORDER — BELSOMRA 10 MG PO TABS: 10.0000 mg | ORAL_TABLET | Freq: Every day | ORAL | 5 refills | Status: DC

## 2023-05-09 NOTE — Telephone Encounter (Signed)
Please advise 

## 2023-05-09 NOTE — Telephone Encounter (Signed)
Ok to change prescription to daily before bedtime.  Katina Degree. Jimmey Ralph, MD 05/09/2023 12:11 PM

## 2023-05-15 LAB — CUP PACEART REMOTE DEVICE CHECK
Date Time Interrogation Session: 20240930005500
Implantable Pulse Generator Implant Date: 20230327
Pulse Gen Serial Number: 177206

## 2023-06-01 ENCOUNTER — Other Ambulatory Visit: Payer: Self-pay

## 2023-06-01 ENCOUNTER — Emergency Department (HOSPITAL_BASED_OUTPATIENT_CLINIC_OR_DEPARTMENT_OTHER)
Admission: EM | Admit: 2023-06-01 | Discharge: 2023-06-02 | Disposition: A | Discharge status: Home / Self Care | Payer: Medicare Other | Attending: Emergency Medicine | Admitting: Emergency Medicine

## 2023-06-01 ENCOUNTER — Encounter (HOSPITAL_BASED_OUTPATIENT_CLINIC_OR_DEPARTMENT_OTHER): Payer: Self-pay | Admitting: Emergency Medicine

## 2023-06-01 DIAGNOSIS — Z7982 Long term (current) use of aspirin: Secondary | ICD-10-CM | POA: Insufficient documentation

## 2023-06-01 DIAGNOSIS — M542 Cervicalgia: Secondary | ICD-10-CM | POA: Insufficient documentation

## 2023-06-01 DIAGNOSIS — R9431 Abnormal electrocardiogram [ECG] [EKG]: Secondary | ICD-10-CM | POA: Diagnosis not present

## 2023-06-01 LAB — CBC WITH DIFFERENTIAL/PLATELET
Abs Immature Granulocytes: 0.05 10*3/uL (ref 0.00–0.07)
Basophils Absolute: 0 10*3/uL (ref 0.0–0.1)
Basophils Relative: 0 %
Eosinophils Absolute: 0.1 10*3/uL (ref 0.0–0.5)
Eosinophils Relative: 1 %
HCT: 40 % (ref 39.0–52.0)
Hemoglobin: 13.3 g/dL (ref 13.0–17.0)
Immature Granulocytes: 1 %
Lymphocytes Relative: 21 %
Lymphs Abs: 1.8 10*3/uL (ref 0.7–4.0)
MCH: 30.2 pg (ref 26.0–34.0)
MCHC: 33.3 g/dL (ref 30.0–36.0)
MCV: 90.9 fL (ref 80.0–100.0)
Monocytes Absolute: 0.8 10*3/uL (ref 0.1–1.0)
Monocytes Relative: 10 %
Neutro Abs: 5.8 10*3/uL (ref 1.7–7.7)
Neutrophils Relative %: 67 %
Platelets: 252 10*3/uL (ref 150–400)
RBC: 4.4 MIL/uL (ref 4.22–5.81)
RDW: 13 % (ref 11.5–15.5)
WBC: 8.5 10*3/uL (ref 4.0–10.5)
nRBC: 0 % (ref 0.0–0.2)

## 2023-06-01 LAB — URINALYSIS, ROUTINE W REFLEX MICROSCOPIC
Bilirubin Urine: NEGATIVE
Glucose, UA: NEGATIVE mg/dL
Hgb urine dipstick: NEGATIVE
Ketones, ur: NEGATIVE mg/dL
Leukocytes,Ua: NEGATIVE
Nitrite: NEGATIVE
Protein, ur: NEGATIVE mg/dL
Specific Gravity, Urine: 1.013 (ref 1.005–1.030)
pH: 7.5 (ref 5.0–8.0)

## 2023-06-01 NOTE — ED Triage Notes (Signed)
  Patient comes in with L sided neck pain that started last night before bed.  Patient states the pain is below his L ear and hurts when he turns his head from side to side.  Patient states he was doing his daily exercises earlier today and had a vision change and felt weaker than normal.  Son in triage states he was having more trouble ambulating than usual.  Hx of previous strokes.  NIH - 0 in triage.  Pain in neck is intermittent but 10/10, sharp/stabbing.

## 2023-06-02 ENCOUNTER — Emergency Department (HOSPITAL_BASED_OUTPATIENT_CLINIC_OR_DEPARTMENT_OTHER): Payer: Medicare Other

## 2023-06-02 DIAGNOSIS — M542 Cervicalgia: Secondary | ICD-10-CM | POA: Diagnosis not present

## 2023-06-02 DIAGNOSIS — G9389 Other specified disorders of brain: Secondary | ICD-10-CM | POA: Diagnosis not present

## 2023-06-02 DIAGNOSIS — I672 Cerebral atherosclerosis: Secondary | ICD-10-CM | POA: Diagnosis not present

## 2023-06-02 DIAGNOSIS — I6523 Occlusion and stenosis of bilateral carotid arteries: Secondary | ICD-10-CM | POA: Diagnosis not present

## 2023-06-02 LAB — COMPREHENSIVE METABOLIC PANEL
ALT: 16 U/L (ref 0–44)
AST: 20 U/L (ref 15–41)
Albumin: 4.7 g/dL (ref 3.5–5.0)
Alkaline Phosphatase: 81 U/L (ref 38–126)
Anion gap: 8 (ref 5–15)
BUN: 18 mg/dL (ref 8–23)
CO2: 31 mmol/L (ref 22–32)
Calcium: 10.1 mg/dL (ref 8.9–10.3)
Chloride: 100 mmol/L (ref 98–111)
Creatinine, Ser: 1.06 mg/dL (ref 0.61–1.24)
GFR, Estimated: >60 mL/min (ref >60)
Glucose, Bld: 119 mg/dL — ABNORMAL HIGH (ref 70–99)
Potassium: 4.3 mmol/L (ref 3.5–5.1)
Sodium: 139 mmol/L (ref 135–145)
Total Bilirubin: 0.5 mg/dL (ref 0.3–1.2)
Total Protein: 7.5 g/dL (ref 6.5–8.1)

## 2023-06-02 MED ORDER — ACETAMINOPHEN 500 MG PO TABS
1000.0000 mg | ORAL_TABLET | Freq: Once | ORAL | Status: AC
  Administered 2023-06-02: 1000 mg via ORAL
  Filled 2023-06-02: qty 2

## 2023-06-02 MED ORDER — IOHEXOL 350 MG/ML SOLN
100.0000 mL | Freq: Once | INTRAVENOUS | Status: AC | PRN
  Administered 2023-06-02: 80 mL via INTRAVENOUS

## 2023-06-02 MED ORDER — METHOCARBAMOL 500 MG PO TABS
500.0000 mg | ORAL_TABLET | Freq: Two times a day (BID) | ORAL | 0 refills | Status: DC | PRN
Start: 2023-06-02 — End: 2023-06-07

## 2023-06-02 NOTE — ED Notes (Signed)
Pt amb around room with walker without difficulty or complaints.

## 2023-06-02 NOTE — Discharge Instructions (Signed)
You may use over-the-counter Motrin (Ibuprofen), Acetaminophen (Tylenol), topical muscle creams such as SalonPas, Icy Hot, Bengay, etc. Please stretch, apply ice or heat (whichever helps), and have massage therapy for additional assistance.  

## 2023-06-02 NOTE — ED Notes (Signed)
Patient transported to CT 

## 2023-06-02 NOTE — ED Provider Notes (Signed)
EMERGENCY DEPARTMENT AT [facility] Provider Note  CSN: [id number] Arrival date & time: 06/01/23 2229  Chief Complaint(s) Neck Pain and Weakness  HPI Brandon Gray is a 89 y.o. male with a past medical history listed below who presents to the emergency department with 1 day of left-sided neck pain that began while sitting down.  Pain is worse with range of motion of the neck.  He denied any fall or trauma.  No fevers or chills.  He describes vision change where letters that usually appear large or smaller.  Son also reported that the patient appears to be walking slower than normal.  At baseline he walks with a walker at a steady pace.  Otherwise no focal deficits.  No dizziness.  HPI  Past Medical History Past Medical History:  Diagnosis Date   Hearing loss    Hyperlipidemia    Seizures (HCC)    TIA (transient ischemic attack)    Patient Active Problem List   Diagnosis Date Noted   Prediabetes 03/22/2023   Labial lesion 10/31/2022   Esophageal dysphagia 08/09/2022   Debility 07/28/2022   Insomnia 07/28/2022   Carotid stenosis, asymptomatic 11/08/2021   Stroke (HCC) 11/07/2021   Sensorineural hearing loss, bilateral 11/06/2021   Seizure disorder (HCC) 09/07/2020   Hyperlipidemia 09/07/2020   Hearing loss 09/07/2020   Home Medication(s) Prior to Admission medications   Medication Sig Start Date End Date Taking? Authorizing Provider  methocarbamol (ROBAXIN) 500 MG tablet Take 1-2 tablets (500-1,000 mg total) by mouth 2 (two) times daily as needed for muscle spasms. 06/02/23  Yes Sanaz Scarlett, Amadeo Garnet, MD  aspirin EC 81 MG tablet Take 1 tablet (81 mg total) by mouth daily. Swallow whole. 03/22/23   Ardith Dark, MD  lamoTRIgine (LAMICTAL) 200 MG tablet Take 1 tablet (200 mg total) by mouth 2 (two) times daily. 03/22/23   Ardith Dark, MD  simvastatin (ZOCOR) 40 MG tablet Take 1 tablet (40 mg total) by mouth daily. 03/22/23 04/21/23  Ardith Dark, MD   Suvorexant (BELSOMRA) 10 MG TABS Take 1 tablet (10 mg total) by mouth at bedtime. 05/09/23   Ardith Dark, MD                                                                                                                                    Allergies Patient has no known allergies.  Review of Systems Review of Systems As noted in HPI  Physical Exam Vital Signs  I have reviewed the triage vital signs BP (!) 123/92   Pulse 71   Temp 98.3 F (36.8 C) (Oral)   Resp 17   Ht 5\' 4"  (1.626 m)   Wt 64 kg   SpO2 99%   BMI 24.20 kg/m   Physical Exam Vitals reviewed.  Constitutional:      General: He is not in acute distress.    Appearance: He is well-developed.  He is not diaphoretic.  HENT:     Head: Normocephalic and atraumatic.     Nose: Nose normal.  Eyes:     General: No scleral icterus.       Right eye: No discharge.        Left eye: No discharge.     Conjunctiva/sclera: Conjunctivae normal.     Pupils: Pupils are equal, round, and reactive to light.  Neck:     Vascular: Carotid bruit present.   Cardiovascular:     Rate and Rhythm: Normal rate and regular rhythm.     Heart sounds: No murmur heard.    No friction rub. No gallop.  Pulmonary:     Effort: Pulmonary effort is normal. No respiratory distress.     Breath sounds: Normal breath sounds. No stridor. No rales.  Abdominal:     General: There is no distension.     Palpations: Abdomen is soft.     Tenderness: There is no abdominal tenderness.  Musculoskeletal:        General: No tenderness.     Cervical back: Normal range of motion and neck supple. No rigidity. Pain with movement and muscular tenderness present. No spinous process tenderness.  Skin:    General: Skin is warm and dry.     Findings: No erythema or rash.  Neurological:     Mental Status: He is alert and oriented to person, place, and time.     Comments: Mental Status:  Alert and oriented to person, place, and time.  Attention and concentration  normal.  Speech clear.  Recent memory is intact  Cranial Nerves:  II Visual Fields: Intact to confrontation. Visual fields intact. III, IV, VI: Pupils equal and reactive to light and near. Full eye movement without nystagmus  V Facial Sensation: Normal. No weakness of masticatory muscles  VII: No facial weakness or asymmetry  VIII Auditory Acuity: Hard of hearing IX/X: The uvula is midline; the palate elevates symmetrically  XI: Normal sternocleidomastoid and trapezius strength  XII: The tongue is midline. No atrophy or fasciculations.   Motor System: Muscle Strength: 5/5 and symmetric in the upper and lower extremities. No pronation or drift.  Muscle Tone: Tone and muscle bulk are normal in the upper and lower extremities.  Coordination: Intact finger-to-nose, heel-to-shin. No tremor.  Sensation: Intact to light touch. Gait: initially deferred      ED Results and Treatments Labs (all labs ordered are listed, but only abnormal results are displayed) Labs Reviewed  COMPREHENSIVE METABOLIC PANEL - Abnormal; Notable for the following components:      Result Value   Glucose, Bld 119 (*)    All other components within normal limits  CBC WITH DIFFERENTIAL/PLATELET  URINALYSIS, ROUTINE W REFLEX MICROSCOPIC                                                                                                                         EKG  EKG Interpretation Date/Time:  Thursday June 01 2023 23:22:23 EDT Ventricular Rate:  70 PR Interval:  219 QRS Duration:  156 QT Interval:  427 QTC Calculation: 461 R Axis:   -33  Text Interpretation: Sinus arrhythmia Borderline prolonged PR interval Right bundle branch block Left ventricular hypertrophy Confirmed by Drema Pry [phone]) on 06/02/2023 1:18:09 AM       Radiology CT ANGIO HEAD NECK W WO CM  Result Date: 06/02/2023 CLINICAL DATA:  Left-sided neck pain, vision changes, weakness EXAM: CT ANGIOGRAPHY HEAD AND NECK WITH AND WITHOUT  CONTRAST TECHNIQUE: Multidetector CT imaging of the head and neck was performed using the standard protocol during bolus administration of intravenous contrast. Multiplanar CT image reconstructions and MIPs were obtained to evaluate the vascular anatomy. Carotid stenosis measurements (when applicable) are obtained utilizing NASCET criteria, using the distal internal carotid diameter as the denominator. RADIATION DOSE REDUCTION: This exam was performed according to the departmental dose-optimization program which includes automated exposure control, adjustment of the mA and/or kV according to patient size and/or use of iterative reconstruction technique. CONTRAST:  80mL OMNIPAQUE IOHEXOL 350 MG/ML SOLN COMPARISON:  11/07/2021 CTA head neck FINDINGS: CT HEAD FINDINGS Brain: No evidence of acute infarct, hemorrhage, mass, mass effect, or midline shift. No hydrocephalus or extra-axial fluid collection. Age related cerebral atrophy with ex vacuo dilatation of the ventricles. Periventricular white matter changes, likely the sequela of chronic small vessel ischemic disease. Vascular: No hyperdense vessel. Atherosclerotic calcifications in the intracranial carotid and vertebral arteries. Skull: Negative for fracture or focal lesion. Sinuses/Orbits: No acute finding. Postsurgical changes in the globes. Other: The mastoid air cells are well aerated. CTA NECK FINDINGS Aortic arch: Standard branching. Imaged portion shows no evidence of aneurysm or dissection. No significant stenosis of the major arch vessel origins. Right carotid system: No evidence of dissection, occlusion, or hemodynamically significant stenosis (greater than 50%) in the common or internal carotid artery. Left carotid system: At least 70% stenosis in the proximal left ICA, secondary to calcified and noncalcified plaque. No evidence of dissection. Vertebral arteries: No evidence of dissection, occlusion, or hemodynamically significant stenosis (greater than  50%). Skeleton: No acute osseous abnormality. Degenerative changes in the cervical spine. Other neck: No acute finding. Upper chest: No focal pulmonary opacity or pleural effusion. Review of the MIP images confirms the above findings CTA HEAD FINDINGS Anterior circulation: Both internal carotid arteries are patent to the termini, with severe stenosis in the left supraclinoid ICA and mild stenosis in the right supraclinoid ICA. Patent right A1. Hypoplastic left A1. Normal anterior communicating artery. Anterior cerebral arteries are patent to their distal aspects without significant stenosis. No M1 stenosis or occlusion. MCA branches perfused to their distal aspects without significant stenosis. Posterior circulation: Vertebral arteries patent to the vertebrobasilar junction with mild stenosis in the proximal right V4 posterior inferior cerebellar arteries patent proximally. Basilar patent to its distal aspect without significant stenosis. Superior cerebellar arteries patent proximally. Patent P1 segments. PCAs perfused to their distal aspects without significant stenosis. The bilateral posterior communicating arteries are not visualized. Venous sinuses: As permitted by contrast timing, patent. Anatomic variants: None significant. No evidence of aneurysm or vascular malformation. Review of the MIP images confirms the above findings IMPRESSION: 1. No acute intracranial process. No intracranial large vessel occlusion. 2. At least 70% stenosis in the proximal left ICA. No other hemodynamically significant stenosis in the neck. 3. Severe stenosis in the left supraclinoid ICA and mild stenosis in the right supraclinoid ICA and right V4. Electronically Signed   By: Jill Side  Vasan M.D.   On: 06/02/2023 02:11    Medications Ordered in ED Medications  acetaminophen (TYLENOL) tablet 1,000 mg (1,000 mg Oral Given 06/02/23 0115)  iohexol (OMNIPAQUE) 350 MG/ML injection 100 mL (80 mLs Intravenous Contrast Given 06/02/23 0049)    Procedures Procedures  (including critical care time) Medical Decision Making / ED Course   Medical Decision Making Amount and/or Complexity of Data Reviewed Labs: ordered. Radiology: ordered.  Risk OTC drugs. Prescription drug management.    Workup for differential diagnosis listed below  CBC without leukocytosis.  No anemia.  Metabolic panel without significant electrolyte derangements or renal sufficiency.  UA obtained in triage negative for infection.  CT angio head and neck negative for acute arterial process.  Known and stable left ICA stenosis. No PTA, RPA, or other infectious process.  Patient provided with Tylenol.  Likely muscular spasm/torticollis.    On reassessment, patient able to ambulate at baseline with walker briskly.  Patient also able to ambulate without walker.     Final Clinical Impression(s) / ED Diagnoses Final diagnoses:  Neck pain   The patient appears reasonably screened and/or stabilized for discharge and I doubt any other medical condition or other [facility] requiring further screening, evaluation, or treatment in the ED at this time. I have discussed the findings, Dx and Tx plan with the patient/family who expressed understanding and agree(s) with the plan. Discharge instructions discussed at length. The patient/family was given strict return precautions who verbalized understanding of the instructions. No further questions at time of discharge.  Disposition: Discharge  Condition: Good  ED Discharge Orders          Ordered    methocarbamol (ROBAXIN) 500 MG tablet  2 times daily PRN        06/02/23 0305             Follow Up: Ardith Dark, MD [address] [place] [address] [phone]  Call  to schedule an appointment for close follow up    This chart was dictated using voice recognition software.  Despite best efforts to proofread,  errors can occur which can change the documentation meaning.    Nira Conn, MD 06/02/23 [phone]

## 2023-06-05 NOTE — Plan of Care (Signed)
[facility] Tonsillectomy/Adenoidectomy, Postoperative PEDS care plan entered in error.

## 2023-06-07 ENCOUNTER — Other Ambulatory Visit: Payer: Self-pay

## 2023-06-07 ENCOUNTER — Encounter: Payer: Self-pay | Admitting: Family Medicine

## 2023-06-07 ENCOUNTER — Emergency Department (HOSPITAL_BASED_OUTPATIENT_CLINIC_OR_DEPARTMENT_OTHER)
Admission: EM | Admit: 2023-06-07 | Discharge: 2023-06-07 | Disposition: A | Discharge status: Home / Self Care | Payer: Medicare Other | Attending: Emergency Medicine | Admitting: Emergency Medicine

## 2023-06-07 DIAGNOSIS — M542 Cervicalgia: Secondary | ICD-10-CM | POA: Insufficient documentation

## 2023-06-07 DIAGNOSIS — W19XXXA Unspecified fall, initial encounter: Secondary | ICD-10-CM | POA: Insufficient documentation

## 2023-06-07 DIAGNOSIS — Z7982 Long term (current) use of aspirin: Secondary | ICD-10-CM | POA: Insufficient documentation

## 2023-06-07 MED ORDER — LIDOCAINE 5 % EX PTCH
1.0000 | MEDICATED_PATCH | CUTANEOUS | Status: DC
  Administered 2023-06-07: 1 via TRANSDERMAL
  Filled 2023-06-07: qty 1

## 2023-06-07 MED ORDER — METHOCARBAMOL 500 MG PO TABS
500.0000 mg | ORAL_TABLET | Freq: Two times a day (BID) | ORAL | 0 refills | Status: DC | PRN
Start: 2023-06-07 — End: 2023-09-25

## 2023-06-07 MED ORDER — METHOCARBAMOL 500 MG PO TABS
500.0000 mg | ORAL_TABLET | Freq: Once | ORAL | Status: AC
  Administered 2023-06-07: 500 mg via ORAL
  Filled 2023-06-07: qty 1

## 2023-06-07 NOTE — ED Notes (Signed)
Attempted to call [facility] with no answer.

## 2023-06-07 NOTE — ED Provider Notes (Signed)
[place] EMERGENCY DEPARTMENT AT [facility] Provider Note   CSN: [id number] Arrival date & time: 06/07/23  4098     History  Chief Complaint  Patient presents with   Neck Pain    Brandon Gray is a 89 y.o. male.   Neck Pain    89 year old male presenting to the Emergency Department from [facility] assisted living complaining of neck pain.  He was seen in the emergency department on last week and had CTA imaging on 10/17 which showed 70% stenosis of the proximal left ICA without any other hemodynamically significant stenosis in the neck.  He also had severe stenosis of the left supraclinoid ICA.  He denies any strokelike symptoms.  No numbness, weakness, slurred speech, facial droop, vision changes.  No difficulty swallowing.  He has had at least 2 falls since his ER visit but denies any significant head trauma.  States he did fall backwards and his neck hit a cushion but denies any new worsening pain after these falls.  He is not on anticoagulation.  He continues to endorse sharp shooting pain in the left lateral aspect of his neck without significant radiation down the arms.  Worse with movement and twisting and associated with some spasms.  He had been prescribed a muscle relaxant which had not yet been delivered by mail order pharmacy to his assisted living facility.  He presents for pain control primarily.  Home Medications Prior to Admission medications   Medication Sig Start Date End Date Taking? Authorizing Provider  aspirin EC 81 MG tablet Take 1 tablet (81 mg total) by mouth daily. Swallow whole. 03/22/23   Ardith Dark, MD  lamoTRIgine (LAMICTAL) 200 MG tablet Take 1 tablet (200 mg total) by mouth 2 (two) times daily. 03/22/23   Ardith Dark, MD  methocarbamol (ROBAXIN) 500 MG tablet Take 1-2 tablets (500-1,000 mg total) by mouth 2 (two) times daily as needed for muscle spasms. 06/07/23   Ernie Avena, MD  simvastatin (ZOCOR) 40 MG tablet Take 1 tablet (40  mg total) by mouth daily. 03/22/23 04/21/23  Ardith Dark, MD  Suvorexant (BELSOMRA) 10 MG TABS Take 1 tablet (10 mg total) by mouth at bedtime. 05/09/23   Ardith Dark, MD      Allergies    Patient has no known allergies.    Review of Systems   Review of Systems  Musculoskeletal:  Positive for neck pain.  All other systems reviewed and are negative.   Physical Exam Updated Vital Signs BP (!) 140/74 (BP Location: Right Arm)   Pulse 75   Temp 98.6 F (37 C) (Oral)   Resp 18   Ht 5\' 6"  (1.676 m)   Wt 64.4 kg   SpO2 97%   BMI 22.92 kg/m  Physical Exam Vitals and nursing note reviewed.  Constitutional:      General: He is not in acute distress.    Appearance: He is well-developed.     Comments: GCS 15, ABC intact  HENT:     Head: Normocephalic and atraumatic.  Eyes:     Conjunctiva/sclera: Conjunctivae normal.  Neck:     Comments: Range of motion intact, mild paraspinal muscular tenderness along the left neck, no midline tenderness, negative Spurling sign Cardiovascular:     Rate and Rhythm: Normal rate and regular rhythm.  Pulmonary:     Effort: Pulmonary effort is normal. No respiratory distress.     Breath sounds: Normal breath sounds.  Chest:     Comments:  Chest wall stable and non-tender to AP and lateral compression. Clavicles stable and non-tender to AP compression Abdominal:     Palpations: Abdomen is soft.     Tenderness: There is no abdominal tenderness.     Comments: Pelvis stable to lateral compression.  Musculoskeletal:        General: No swelling.     Cervical back: Normal range of motion and neck supple.     Comments: No midline tenderness to palpation of the thoracic or lumbar spine. Extremities atraumatic with intact ROM.   Skin:    General: Skin is warm and dry.     Capillary Refill: Capillary refill takes less than 2 seconds.  Neurological:     Mental Status: He is alert.     Comments: MENTAL STATUS EXAM:    Orientation: Alert and oriented to  person, place and time.  Memory: Cooperative, follows commands well.  Language: Speech is clear and language is normal.   CRANIAL NERVES:    CN 2 (Optic): Visual fields intact to confrontation.  CN 3,4,6 (EOM): Pupils equal and reactive to light. Full extraocular eye movement without nystagmus.  CN 5 (Trigeminal): Facial sensation is normal, no weakness of masticatory muscles.  CN 7 (Facial): No facial weakness or asymmetry.  CN 8 (Auditory): Auditory acuity grossly normal.  CN 9,10 (Glossophar): The uvula is midline, the palate elevates symmetrically.  CN 11 (spinal access): Normal sternocleidomastoid and trapezius strength.  CN 12 (Hypoglossal): The tongue is midline. No atrophy or fasciculations.Marland Kitchen   MOTOR:  Muscle Strength: 5/5RUE, 5/5LUE, 5/5RLE, 5/5LLE.   COORDINATION:   Intact finger-to-nose, no tremor.   SENSATION:   Intact to light touch all four extremities.  GAIT: Gait normal without ataxia   Psychiatric:        Mood and Affect: Mood normal.     ED Results / Procedures / Treatments   Labs (all labs ordered are listed, but only abnormal results are displayed) Labs Reviewed - No data to display  EKG None  Radiology No results found.  Procedures Procedures    Medications Ordered in ED Medications  methocarbamol (ROBAXIN) tablet 500 mg (500 mg Oral Given 06/07/23 1610)    ED Course/ Medical Decision Making/ A&P                                 Medical Decision Making Risk Prescription drug management.     89 year old male presenting to the Emergency Department from [facility] assisted living complaining of neck pain.  He was seen in the emergency department on last week and had CTA imaging on 10/17 which showed 70% stenosis of the proximal left ICA without any other hemodynamically significant stenosis in the neck.  He also had severe stenosis of the left supraclinoid ICA.  He denies any strokelike symptoms.  No numbness, weakness, slurred speech, facial  droop, vision changes.  No difficulty swallowing.  He has had at least 2 falls since his ER visit but denies any significant head trauma.  States he did fall backwards and his neck hit a cushion but denies any new worsening pain after these falls.  He is not on anticoagulation.  He continues to endorse sharp shooting pain in the left lateral aspect of his neck without significant radiation down the arms.  Worse with movement and twisting and associated with some spasms.  He had been prescribed a muscle relaxant which had not yet been delivered by  mail order pharmacy to his assisted living facility.  He presents for pain control primarily.  On arrival, the patient was vitally stable.  Presenting with persistent left-sided neck pain that appears to be musculoskeletal in nature versus a component of degenerative disc disease.  Negative Spurling sign, no weakness on exam and no strokelike symptoms.  Does have known stenosis.  Just recently underwent CT imaging of the neck and head 5 days ago.  Patient presenting primarily for pain control as his Robaxin had not yet been delivered to his assisted living facility.  The patient was prescribed Robaxin sent to a pharmacy that his son can pick up, I discussed the care of the patient with his son who agreed no significant trauma to the neck, patient presenting with persistent pain, no significantly new findings of pain, no neurologic stroke deficits.  Patient likely having ongoing muscular spasm.  On my assessment, the patient's pain was mostly resolved.  He was administered a lidocaine patch and Robaxin and on repeat assessment he was completely asymptomatic, well-appearing, ambulatory.  I recommended continued follow-up outpatient regarding his CT findings, advised follow-up with his PCP.  Overall at this time, stable for discharge.  DC Instructions: Pain had resolved on exam, he had no no midline tenderness.  Your recent CT imaging revealed the  following: IMPRESSION: 1. No acute intracranial process. No intracranial large vessel occlusion. 2. At least 70% stenosis in the proximal left ICA. No other hemodynamically significant stenosis in the neck. 3. Severe stenosis in the left supraclinoid ICA and mild stenosis in the right supraclinoid ICA and right V4.  There was no indication for repeat CT imaging of your neck as you recently had undergone this CT imaging and you denied any significant new trauma to your head or neck.  Recommend you try over-the-counter lidocaine patches, pick up your Robaxin prescription which I have represcribed for you, also try Tylenol for pain control, follow-up outpatient with your primary doctor regarding your CT findings.  Neurologic exam and trauma exam was reassuring.    Final Clinical Impression(s) / ED Diagnoses Final diagnoses:  Neck pain    Rx / DC Orders ED Discharge Orders          Ordered    methocarbamol (ROBAXIN) 500 MG tablet  2 times daily PRN        06/07/23 0700              Ernie Avena, MD 06/07/23 1935

## 2023-06-07 NOTE — ED Notes (Signed)
Patient assisted with urinal, cleaned and dressed, taken via wheelchair for transport POV to facility by friend/family.

## 2023-06-07 NOTE — Discharge Instructions (Addendum)
Pain had resolved on exam, he had no no midline tenderness.  Your recent CT imaging revealed the following: IMPRESSION: 1. No acute intracranial process. No intracranial large vessel occlusion. 2. At least 70% stenosis in the proximal left ICA. No other hemodynamically significant stenosis in the neck. 3. Severe stenosis in the left supraclinoid ICA and mild stenosis in the right supraclinoid ICA and right V4.  There was no indication for repeat CT imaging of your neck as you recently had undergone this CT imaging and you denied any significant new trauma to your head or neck.  Recommend you try over-the-counter lidocaine patches, pick up your Robaxin prescription which I have represcribed for you, also try Tylenol for pain control, follow-up outpatient with your primary doctor regarding your CT findings.  Neurologic exam and trauma exam was reassuring.

## 2023-06-07 NOTE — ED Triage Notes (Addendum)
Patient presents to ED from [facility] c/o of neck pain since Thursday of last week. EMS reports patient has had multiple falls in the last two days. Patient denies blood thinners and denies hitting head. No weakness noted to any extremities. Patient is awake and alert, airway intact, NAD.

## 2023-06-07 NOTE — Telephone Encounter (Signed)
See note

## 2023-06-08 NOTE — Telephone Encounter (Signed)
Okay to write letter 

## 2023-06-09 ENCOUNTER — Encounter: Payer: Self-pay | Admitting: Family Medicine

## 2023-06-15 ENCOUNTER — Ambulatory Visit (INDEPENDENT_AMBULATORY_CARE_PROVIDER_SITE_OTHER): Payer: Medicare Other

## 2023-06-15 DIAGNOSIS — I639 Cerebral infarction, unspecified: Secondary | ICD-10-CM

## 2023-06-15 LAB — CUP PACEART REMOTE DEVICE CHECK
Date Time Interrogation Session: 20241031010000
Implantable Pulse Generator Implant Date: 20230327
Pulse Gen Serial Number: 177206

## 2023-06-28 NOTE — Progress Notes (Signed)
Carelink Summary Report / Loop Recorder 

## 2023-07-17 ENCOUNTER — Ambulatory Visit (INDEPENDENT_AMBULATORY_CARE_PROVIDER_SITE_OTHER): Payer: Medicare Other

## 2023-07-17 DIAGNOSIS — I639 Cerebral infarction, unspecified: Secondary | ICD-10-CM

## 2023-07-17 LAB — CUP PACEART REMOTE DEVICE CHECK
Date Time Interrogation Session: 20241202010600
Implantable Pulse Generator Implant Date: 20230327
Pulse Gen Serial Number: 177206

## 2023-08-21 ENCOUNTER — Ambulatory Visit: Payer: Medicare Other

## 2023-08-21 DIAGNOSIS — I639 Cerebral infarction, unspecified: Secondary | ICD-10-CM | POA: Diagnosis not present

## 2023-08-21 LAB — CUP PACEART REMOTE DEVICE CHECK
Date Time Interrogation Session: 20250106001300
Implantable Pulse Generator Implant Date: 20230327
Pulse Gen Serial Number: 177206

## 2023-09-04 ENCOUNTER — Telehealth: Payer: Self-pay | Admitting: *Deleted

## 2023-09-04 NOTE — Telephone Encounter (Signed)
FM2 form received and placed in PCP office to be reviewed

## 2023-09-11 NOTE — Telephone Encounter (Signed)
Form faxed to [phone] on 09/08/2023 Form placed to be scan in patient chart

## 2023-09-25 ENCOUNTER — Encounter: Payer: Self-pay | Admitting: Family Medicine

## 2023-09-25 ENCOUNTER — Ambulatory Visit: Payer: Medicare Other | Admitting: Family Medicine

## 2023-09-25 ENCOUNTER — Ambulatory Visit (INDEPENDENT_AMBULATORY_CARE_PROVIDER_SITE_OTHER): Payer: Medicare Other

## 2023-09-25 VITALS — BP 118/64 | HR 72 | Temp 97.3°F | Wt 139.6 lb

## 2023-09-25 VITALS — BP 118/64 | HR 72 | Temp 97.3°F | Ht 66.0 in | Wt 139.0 lb

## 2023-09-25 DIAGNOSIS — G40909 Epilepsy, unspecified, not intractable, without status epilepticus: Secondary | ICD-10-CM | POA: Diagnosis not present

## 2023-09-25 DIAGNOSIS — R739 Hyperglycemia, unspecified: Secondary | ICD-10-CM

## 2023-09-25 DIAGNOSIS — M542 Cervicalgia: Secondary | ICD-10-CM

## 2023-09-25 DIAGNOSIS — Z0000 Encounter for general adult medical examination without abnormal findings: Secondary | ICD-10-CM

## 2023-09-25 DIAGNOSIS — I639 Cerebral infarction, unspecified: Secondary | ICD-10-CM | POA: Diagnosis not present

## 2023-09-25 DIAGNOSIS — E785 Hyperlipidemia, unspecified: Secondary | ICD-10-CM | POA: Diagnosis not present

## 2023-09-25 DIAGNOSIS — G4700 Insomnia, unspecified: Secondary | ICD-10-CM | POA: Diagnosis not present

## 2023-09-25 LAB — COMPREHENSIVE METABOLIC PANEL
ALT: 19 U/L (ref 0–53)
AST: 21 U/L (ref 0–37)
Albumin: 4.5 g/dL (ref 3.5–5.2)
Alkaline Phosphatase: 81 U/L (ref 39–117)
BUN: 21 mg/dL (ref 6–23)
CO2: 30 meq/L (ref 19–32)
Calcium: 9.9 mg/dL (ref 8.4–10.5)
Chloride: 104 meq/L (ref 96–112)
Creatinine, Ser: 1.01 mg/dL (ref 0.40–1.50)
GFR: 64.14 mL/min (ref >60.00)
Glucose, Bld: 66 mg/dL — ABNORMAL LOW (ref 70–99)
Potassium: 4.7 meq/L (ref 3.5–5.1)
Sodium: 142 meq/L (ref 135–145)
Total Bilirubin: 0.5 mg/dL (ref 0.2–1.2)
Total Protein: 6.8 g/dL (ref 6.0–8.3)

## 2023-09-25 LAB — CUP PACEART REMOTE DEVICE CHECK
Date Time Interrogation Session: 20250210002300
Implantable Pulse Generator Implant Date: 20230327
Pulse Gen Serial Number: 177206

## 2023-09-25 LAB — LIPID PANEL
Cholesterol: 133 mg/dL (ref 0–200)
HDL: 59.4 mg/dL (ref >39.00)
LDL Cholesterol: 56 mg/dL (ref 0–99)
NonHDL: 73.92
Total CHOL/HDL Ratio: 2
Triglycerides: 88 mg/dL (ref 0.0–149.0)
VLDL: 17.6 mg/dL (ref 0.0–40.0)

## 2023-09-25 LAB — CBC
HCT: 38.9 % — ABNORMAL LOW (ref 39.0–52.0)
Hemoglobin: 12.7 g/dL — ABNORMAL LOW (ref 13.0–17.0)
MCHC: 32.7 g/dL (ref 30.0–36.0)
MCV: 92.7 fL (ref 78.0–100.0)
Platelets: 335 10*3/uL (ref 150.0–400.0)
RBC: 4.2 Mil/uL — ABNORMAL LOW (ref 4.22–5.81)
RDW: 13.9 % (ref 11.5–15.5)
WBC: 8 10*3/uL (ref 4.0–10.5)

## 2023-09-25 LAB — HEMOGLOBIN A1C: Hgb A1c MFr Bld: 6 % (ref 4.6–6.5)

## 2023-09-25 LAB — TSH: TSH: 2.3 u[IU]/mL (ref 0.35–5.50)

## 2023-09-25 MED ORDER — MIRTAZAPINE 15 MG PO TABS: 15.0000 mg | ORAL_TABLET | Freq: Every day | ORAL | 11 refills | Status: AC

## 2023-09-25 MED ORDER — MIRTAZAPINE 15 MG PO TABS: 15.0000 mg | ORAL_TABLET | Freq: Every day | ORAL | 11 refills | Status: DC

## 2023-09-25 NOTE — Assessment & Plan Note (Signed)
 Doing well Lipitor 40 mg daily.  Check labs.

## 2023-09-25 NOTE — Assessment & Plan Note (Signed)
 Likely due to underlying degenerative changes.  Symptoms are currently manageable with Tylenol  as needed.  He has had significant improvement in symptoms since being in the ED a few days ago.  We did discuss referral to PT or sports medicine however he declined.  He will let us  know if he has any change in symptoms or if he changes his mind about referral.

## 2023-09-25 NOTE — Assessment & Plan Note (Signed)
 No recurrent seizures.  On Lamictal  200 mg twice daily and tolerating well.

## 2023-09-25 NOTE — Progress Notes (Signed)
   Brandon Gray is a 89 y.o. male who presents today for an office visit.  Assessment/Plan:  Chronic Problems Addressed Today: Neck pain Likely due to underlying degenerative changes.  Symptoms are currently manageable with Tylenol  as needed.  He has had significant improvement in symptoms since being in the ED a few days ago.  We did discuss referral to PT or sports medicine however he declined.  He will let us  know if he has any change in symptoms or if he changes his mind about referral.  Seizure disorder (HCC) No recurrent seizures.  On Lamictal  200 mg twice daily and tolerating well.  Hyperlipidemia Doing well Lipitor 40 mg daily.  Check labs.  Insomnia Did not have much of a response to Belsomra .  Will try mirtazapine  15 mg nightly.  He will follow-up with us  in a few weeks via MyChart we can titrate dose as needed.  Did not have much excess with trazodone previously.  May consider trial of low-dose gabapentin at night if does not do well with mirtazapine .     Subjective:  HPI:  See Assessment / plan for status of chronic conditions.  Patient is here today for follow-up.  I last saw him about 6 months ago.  At that time we increased dose Belsomra  for a sleep.  We continued him on other medications including simvastatin  40 mg daily and Lamictal  200 mg twice daily.  Since our last visit, he went to the emergency room twice with neck pain.  Diagnosed with muscular strain.  He was given a muscle relaxant and discharged home. He had a neck scan there which did show some ICA stenosis. Symptoms have improved significantly thought still does have occasional pain. Symptoms are manageable.   He did not find the valsartan was effective and he is no longer taking this.  He has been consistent with his other medications.       Objective:  Physical Exam: BP 118/64   Pulse 72   Temp (!) 97.3 F (36.3 C) (Temporal)   Ht 5\' 6"  (1.676 m)   Wt 139 lb (63 kg)   SpO2 96%   BMI 22.44  kg/m   Gen: No acute distress, resting comfortably CV: Regular rate and rhythm with no murmurs appreciated Pulm: Normal work of breathing, clear to auscultation bilaterally with no crackles, wheezes, or rhonchi Neuro: Grossly normal, moves all extremities Psych: Normal affect and thought content      Brandon Gray M. Daneil Dunker, MD 09/25/2023 10:41 AM

## 2023-09-25 NOTE — Patient Instructions (Signed)
 Brandon Gray , Thank you for taking time to come for your Medicare Wellness Visit. I appreciate your ongoing commitment to your health goals. Please review the following plan we discussed and let me know if I can assist you in the future.   Referrals/Orders/Follow-Ups/Clinician Recommendations: Maintain health and activity   This is a list of the screening recommended for you and due dates:  Health Maintenance  Topic Date Due   Zoster (Shingles) Vaccine (1 of 2) Never done   COVID-19 Vaccine (8 - 2024-25 season) 04/16/2023   Flu Shot  11/13/2023*   Medicare Annual Wellness Visit  02/23/2024   DTaP/Tdap/Td vaccine (3 - Td or Tdap) 01/20/2032   Pneumonia Vaccine  Completed   HPV Vaccine  Aged Out  *Topic was postponed. The date shown is not the original due date.    Advanced directives: (Provided) Advance directive discussed with you today. I have provided a copy for you to complete at home and have notarized. Once this is complete, please bring a copy in to our office so we can scan it into your chart.   Next Medicare Annual Wellness Visit scheduled for next year: Yes

## 2023-09-25 NOTE — Patient Instructions (Signed)
 It was very nice to see you today!  We will check blood work.  Please start the mirtazapine .  This will help you with the sleep.  Let me know how this is working in a few weeks.  Return in about 6 months (around 03/24/2024).   Take care, Dr Daneil Dunker  PLEASE NOTE:  If you had any lab tests, please let us  know if you have not heard back within a few days. You may see your results on mychart before we have a chance to review them but we will give you a call once they are reviewed by us .   If we ordered any referrals today, please let us  know if you have not heard from their office within the next week.   If you had any urgent prescriptions sent in today, please check with the pharmacy within an hour of our visit to make sure the prescription was transmitted appropriately.   Please try these tips to maintain a healthy lifestyle:  Eat at least 3 REAL meals and 1-2 snacks per day.  Aim for no more than 5 hours between eating.  If you eat breakfast, please do so within one hour of getting up.   Each meal should contain half fruits/vegetables, one quarter protein, and one quarter carbs (no bigger than a computer mouse)  Cut down on sweet beverages. This includes juice, soda, and sweet tea.   Drink at least 1 glass of water with each meal and aim for at least 8 glasses per day  Exercise at least 150 minutes every week.

## 2023-09-25 NOTE — Progress Notes (Signed)
 Subjective:   Yameen Broberg is a 89 y.o. male who presents for Medicare Annual/Subsequent preventive examination.  Visit Complete: In person  Cardiac Risk Factors include: advanced age (>22men, >30 women);male gender;dyslipidemia     Objective:    Today's Vitals   09/25/23 0906  BP: 118/64  Pulse: 72  Temp: (!) 97.3 F (36.3 C)  SpO2: 96%  Weight: 139 lb 9.6 oz (63.3 kg)   Body mass index is 22.53 kg/m.     09/25/2023    9:18 AM 06/07/2023    5:38 AM 06/01/2023   10:57 PM 07/02/2022   12:55 PM 07/02/2022   12:54 PM 11/06/2021    2:30 PM 08/29/2021    3:05 PM  Advanced Directives  Does Patient Have a Medical Advance Directive? No Yes Yes No No Yes Yes  Type of Advance Directive  Living will;Healthcare Power of [employer] Power of [place];Living will;Out of facility DNR (pink MOST or yellow form)   Healthcare Power of [place];Living will Healthcare Power of [place];Living will  Does patient want to make changes to medical advance directive?  No - Patient declined       Copy of Healthcare Power of Attorney in Chart?   No - copy requested    No - copy requested  Would patient like information on creating a medical advance directive? Yes (MAU/Ambulatory/Procedural Areas - Information given)   No - Patient declined No - Patient declined      Current Medications (verified) Outpatient Encounter Medications as of 09/25/2023  Medication Sig   aspirin  EC 81 MG tablet Take 1 tablet (81 mg total) by mouth daily. Swallow whole.   FLUAD 0.5 ML injection    lamoTRIgine  (LAMICTAL ) 200 MG tablet Take 1 tablet (200 mg total) by mouth 2 (two) times daily.   SPIKEVAX syringe    methocarbamol  (ROBAXIN ) 500 MG tablet Take 1-2 tablets (500-1,000 mg total) by mouth 2 (two) times daily as needed for muscle spasms. (Patient not taking: Reported on 09/25/2023)   simvastatin  (ZOCOR ) 40 MG tablet Take 1 tablet (40 mg total) by mouth daily.   Suvorexant  (BELSOMRA ) 10 MG TABS Take 1  tablet (10 mg total) by mouth at bedtime. (Patient not taking: Reported on 09/25/2023)   No facility-administered encounter medications on file as of 09/25/2023.    Allergies (verified) Patient has no known allergies.   History: Past Medical History:  Diagnosis Date   Hearing loss    Hyperlipidemia    Seizures (HCC)    TIA (transient ischemic attack)    Past Surgical History:  Procedure Laterality Date   LOOP RECORDER INSERTION N/A 11/08/2021   Procedure: LOOP RECORDER INSERTION;  Surgeon: Boyce Byes, MD;  Location: [facility];  Service: Cardiovascular;  Laterality: N/A;   Family History  Problem Relation Age of Onset   Diabetes Father    Cancer Daughter    Early death Daughter    Social History   Socioeconomic History   Marital status: Widowed    Spouse name: Not on file   Number of children: Not on file   Years of education: Not on file   Highest education level: Bachelor's degree (e.g., BA, AB, BS)  Occupational History   Not on file  Tobacco Use   Smoking status: Never   Smokeless tobacco: Never  Vaping Use   Vaping status: Never Used  Substance and Sexual Activity   Alcohol use: Yes    Alcohol/week: 7.0 standard drinks of alcohol  Types: 7 Glasses of wine per week   Drug use: Never   Sexual activity: Not Currently  Other Topics Concern   Not on file  Social History Narrative   Not on file   Social Drivers of Health   Financial Resource Strain: Low Risk  (09/25/2023)   Overall Financial Resource Strain (CARDIA)    Difficulty of Paying Living Expenses: Not hard at all  Food Insecurity: No Food Insecurity (09/25/2023)   Hunger Vital Sign    Worried About Running Out of Food in the Last Year: Never true    Ran Out of Food in the Last Year: Never true  Transportation Needs: No Transportation Needs (09/25/2023)   PRAPARE - Administrator, Civil Service (Medical): No    Lack of Transportation (Non-Medical): No  Physical Activity:  Sufficiently Active (09/25/2023)   Exercise Vital Sign    Days of Exercise per Week: 7 days    Minutes of Exercise per Session: 60 min  Stress: Stress Concern Present (09/25/2023)   [employer] of Occupational Health - Occupational Stress Questionnaire    Feeling of Stress : To some extent  Social Connections: Moderately Integrated (09/25/2023)   Social Connection and Isolation Panel [NHANES]    Frequency of Communication with Friends and Family: More than three times a week    Frequency of Social Gatherings with Friends and Family: More than three times a week    Attends Religious Services: More than 4 times per year    Active Member of [employer] or Organizations: Yes    Attends Banker Meetings: 1 to 4 times per year    Marital Status: Widowed    Tobacco Counseling Counseling given: Not Answered   Clinical Intake:  Pre-visit preparation completed: Yes  Pain : No/denies pain     BMI - recorded: 22.53 Nutritional Status: BMI of 19-24  Normal Diabetes: No  How often do you need to have someone help you when you read instructions, pamphlets, or other written materials from your doctor or pharmacy?: 1 - Never  Interpreter Needed?: No  Information entered by :: Lamont Pilsner, LPN   Activities of Daily Living    09/25/2023    9:13 AM  In your present state of health, do you have any difficulty performing the following activities:  Hearing? 1  Comment Hearing aids  Vision? 0  Difficulty concentrating or making decisions? 0  Walking or climbing stairs? 0  Dressing or bathing? 1  Comment assisted  Doing errands, shopping? 1  Comment assistaed living  Quarry manager and eating ? Y  Comment assisted living  Using the Toilet? N  In the past six months, have you accidently leaked urine? N  Do you have problems with loss of bowel control? N  Managing your Medications? Y  Comment assisted living  Managing your Finances? Y  Comment son  Housekeeping or  managing your Housekeeping? Y    Patient Care Team: Rodney Clamp, MD as PCP - General (Family Medicine) Loyde Rule, MD as PCP - Cardiology (Cardiology)  Indicate any recent Medical Services you may have received from other than [facility] providers in the past year (date may be approximate).     Assessment:   This is a routine wellness examination for [place].  Hearing/Vision screen Hearing Screening - Comments:: Pt has hearing aids  Vision Screening - Comments:: [facility]    Goals Addressed  This Visit's Progress    Patient Stated       Maintain health and activity        Depression Screen    09/25/2023    9:22 AM 03/22/2023    8:27 AM 07/28/2022   12:54 PM 02/05/2021   10:23 AM 09/07/2020   10:48 AM  PHQ 2/9 Scores  PHQ - 2 Score 1 2 0 1 0  PHQ- 9 Score  11       Fall Risk    09/25/2023    9:25 AM 03/22/2023    8:28 AM 07/28/2022   12:55 PM 02/05/2021   10:30 AM  Fall Risk   Falls in the past year? 1 0 0 1  Number falls in past yr: 0 0 0 1  Injury with Fall? 0 0 0 1  Risk for fall due to : History of fall(s) Impaired balance/gait;No Fall Risks  Impaired balance/gait;Impaired vision  Follow up Falls prevention discussed   Falls prevention discussed    MEDICARE RISK AT HOME: Medicare Risk at Home Any stairs in or around the home?: No Home free of loose throw rugs in walkways, pet beds, electrical cords, etc?: Yes Adequate lighting in your home to reduce risk of falls?: Yes Life alert?: Yes Use of a cane, walker or w/c?: Yes Grab bars in the bathroom?: Yes Shower chair or bench in shower?: Yes Elevated toilet seat or a handicapped toilet?: Yes  TIMED UP AND GO:  Was the test performed?  Yes  Length of time to ambulate 10 feet: 15 sec Gait steady and fast with assistive device    Cognitive Function:    09/25/2023    9:28 AM  MMSE - Mini Mental State Exam  Not completed: Unable to complete         02/05/2021   10:33 AM  6CIT Screen  What Year? 4 points  What month? 0 points  What time? 0 points  Count back from 20 0 points  Months in reverse 4 points  Repeat phrase --    Immunizations Immunization History  Administered Date(s) Administered   Dtap, Unspecified 07/03/2012   Influenza Split 05/30/2017   Influenza, High Dose Seasonal PF 05/23/2022   Influenza-Unspecified 06/15/2001, 06/17/2002, 06/18/2003, 06/30/2004, 07/13/2005, 05/24/2006, 05/21/2007, 05/14/2008, 06/24/2010, 07/03/2012, 07/01/2013, 06/02/2014, 06/02/2015, 04/15/2021, 05/23/2022, 05/09/2023   Moderna Covid-19 Vaccine Bivalent Booster 31yrs & up 07/16/2021   Moderna Sars-Covid-2 Vaccination 08/29/2019, 09/25/2019, 07/15/2020, 12/14/2020, 12/31/2020, 07/18/2022   Pneumococcal Conjugate-13 06/02/2015   Pneumococcal Polysaccharide-23 08/15/1998   Pneumococcal-Unspecified 01/22/1999   Td (Adult),unspecified 01/22/1999, 06/24/2010   Tdap 01/19/2022    TDAP status: Up to date  Flu Vaccine status: Up to date  Pneumococcal vaccine status: Up to date  Covid-19 vaccine status: Information provided on how to obtain vaccines.   Qualifies for Shingles Vaccine? Yes   Zostavax completed No   Shingrix Completed?: No.    Education has been provided regarding the importance of this vaccine. Patient has been advised to call insurance company to determine out of pocket expense if they have not yet received this vaccine. Advised may also receive vaccine at local pharmacy or Health Dept. Verbalized acceptance and understanding.  Screening Tests Health Maintenance  Topic Date Due   Zoster Vaccines- Shingrix (1 of 2) Never done   COVID-19 Vaccine (8 - 2024-25 season) 04/16/2023   Medicare Annual Wellness (AWV)  09/24/2024   DTaP/Tdap/Td (3 - Td or Tdap) 01/20/2032   Pneumonia Vaccine 26+ Years old  Completed  INFLUENZA VACCINE  Completed   HPV VACCINES  Aged Out    Health Maintenance  Health Maintenance Due   Topic Date Due   Zoster Vaccines- Shingrix (1 of 2) Never done   COVID-19 Vaccine (8 - 2024-25 season) 04/16/2023    Colorectal cancer screening: No longer required.   Additional Screening:  Vision Screening: Recommended annual ophthalmology exams for early detection of glaucoma and other disorders of the eye. Is the patient up to date with their annual eye exam?  Yes  Who is the provider or what is the name of the office in which the patient attends annual eye exams? [facility]  If pt is not established with a provider, would they like to be referred to a provider to establish care? No .   Dental Screening: Recommended annual dental exams for proper oral hygiene    Community Resource Referral / Chronic Care Management: CRR required this visit?  No   CCM required this visit?  No     Plan:     I have personally reviewed and noted the following in the patient's chart:   Medical and social history Use of alcohol, tobacco or illicit drugs  Current medications and supplements including opioid prescriptions. Patient is not currently taking opioid prescriptions. Functional ability and status Nutritional status Physical activity Advanced directives List of other physicians Hospitalizations, surgeries, and ER visits in previous 12 months Vitals Screenings to include cognitive, depression, and falls Referrals and appointments  In addition, I have reviewed and discussed with patient certain preventive protocols, quality metrics, and best practice recommendations. A written personalized care plan for preventive services as well as general preventive health recommendations were provided to patient.     Bruno Capri, LPN   11/21/8117   After Visit Summary: (In Person-Declined) Patient declined AVS at this time.  Nurse Notes: Pt declined cognition at this time , Pt has appt today with Pcp as well

## 2023-09-25 NOTE — Assessment & Plan Note (Signed)
 Did not have much of a response to Belsomra .  Will try mirtazapine  15 mg nightly.  He will follow-up with us  in a few weeks via MyChart we can titrate dose as needed.  Did not have much excess with trazodone previously.  May consider trial of low-dose gabapentin at night if does not do well with mirtazapine .

## 2023-09-26 ENCOUNTER — Encounter: Payer: Self-pay | Admitting: Family Medicine

## 2023-09-26 NOTE — Progress Notes (Signed)
His labs are all stable.  No need to make any adjustments to his treatment plan.  We can recheck again in 6 to 12 months.

## 2023-09-29 NOTE — Progress Notes (Signed)
Bsx Loop Recorder

## 2023-09-30 ENCOUNTER — Encounter: Payer: Self-pay | Admitting: Cardiology

## 2023-10-30 ENCOUNTER — Ambulatory Visit (INDEPENDENT_AMBULATORY_CARE_PROVIDER_SITE_OTHER): Payer: Medicare Other

## 2023-10-30 DIAGNOSIS — I639 Cerebral infarction, unspecified: Secondary | ICD-10-CM

## 2023-10-30 LAB — CUP PACEART REMOTE DEVICE CHECK
Date Time Interrogation Session: 20250317003300
Implantable Pulse Generator Implant Date: 20230327
Pulse Gen Serial Number: 177206

## 2023-10-31 ENCOUNTER — Encounter: Payer: Self-pay | Admitting: Cardiology

## 2023-11-01 NOTE — Progress Notes (Signed)
 Bsx Loop Recorder

## 2023-11-23 ENCOUNTER — Telehealth: Payer: Self-pay | Admitting: Family Medicine

## 2023-11-23 DIAGNOSIS — Z0279 Encounter for issue of other medical certificate: Secondary | ICD-10-CM

## 2023-11-23 NOTE — Telephone Encounter (Signed)
 [facility] faxed document FL2, to be filled out by provider. Patient requested to send it back via Fax within 5-days. Document is located in providers tray at front office.Please advise at  [phone].

## 2023-11-24 NOTE — Telephone Encounter (Signed)
Form placed to be sign in PCP office

## 2023-11-27 NOTE — Telephone Encounter (Signed)
 Form faxed to [phone] Placed to be scan in patient chart

## 2023-12-04 ENCOUNTER — Ambulatory Visit (INDEPENDENT_AMBULATORY_CARE_PROVIDER_SITE_OTHER): Payer: Medicare Other

## 2023-12-04 DIAGNOSIS — I639 Cerebral infarction, unspecified: Secondary | ICD-10-CM | POA: Diagnosis not present

## 2023-12-05 LAB — CUP PACEART REMOTE DEVICE CHECK
Date Time Interrogation Session: 20250421004400
Implantable Pulse Generator Implant Date: 20230327
Pulse Gen Serial Number: 177206

## 2023-12-09 ENCOUNTER — Encounter: Payer: Self-pay | Admitting: Cardiology

## 2023-12-15 NOTE — Progress Notes (Signed)
 Bsx Loop Recorder

## 2023-12-25 ENCOUNTER — Telehealth: Payer: Self-pay | Admitting: *Deleted

## 2023-12-25 NOTE — Telephone Encounter (Signed)
 Copied from CRM [id number]. Topic: General - Other >> Dec 21, 2023  2:59 PM Allyne Areola wrote: Reason for WUX:[id number] from [facility] will be faxing over an urgent packet that needs to be signed by Dr.Parker. Best call back number is [phone].   Form not received Dinesh Ulysse,RMA

## 2024-01-09 ENCOUNTER — Ambulatory Visit (INDEPENDENT_AMBULATORY_CARE_PROVIDER_SITE_OTHER): Payer: Medicare Other

## 2024-01-09 DIAGNOSIS — I639 Cerebral infarction, unspecified: Secondary | ICD-10-CM

## 2024-01-09 LAB — CUP PACEART REMOTE DEVICE CHECK
Date Time Interrogation Session: 20250527005700
Implantable Pulse Generator Implant Date: 20230327
Pulse Gen Serial Number: 177206

## 2024-01-10 ENCOUNTER — Ambulatory Visit: Payer: Self-pay | Admitting: Cardiology

## 2024-01-13 DIAGNOSIS — F5101 Primary insomnia: Secondary | ICD-10-CM | POA: Diagnosis not present

## 2024-01-23 NOTE — Addendum Note (Signed)
 Addended by: Edra Govern D on: 01/23/2024 09:31 AM   Modules accepted: Orders

## 2024-01-23 NOTE — Progress Notes (Signed)
 Bsx Loop Recorder

## 2024-02-09 ENCOUNTER — Ambulatory Visit (INDEPENDENT_AMBULATORY_CARE_PROVIDER_SITE_OTHER)

## 2024-02-09 ENCOUNTER — Ambulatory Visit: Payer: Self-pay | Admitting: Cardiology

## 2024-02-09 DIAGNOSIS — I639 Cerebral infarction, unspecified: Secondary | ICD-10-CM | POA: Diagnosis not present

## 2024-02-09 LAB — CUP PACEART REMOTE DEVICE CHECK
Date Time Interrogation Session: 20250627010800
Implantable Pulse Generator Implant Date: 20230327
Pulse Gen Serial Number: 177206

## 2024-02-28 NOTE — Progress Notes (Signed)
 Bsx Loop Recorder

## 2024-03-11 ENCOUNTER — Ambulatory Visit (INDEPENDENT_AMBULATORY_CARE_PROVIDER_SITE_OTHER)

## 2024-03-11 DIAGNOSIS — I639 Cerebral infarction, unspecified: Secondary | ICD-10-CM

## 2024-03-12 LAB — CUP PACEART REMOTE DEVICE CHECK
Date Time Interrogation Session: 20250728001700
Implantable Pulse Generator Implant Date: 20230327
Pulse Gen Serial Number: 177206

## 2024-03-14 ENCOUNTER — Ambulatory Visit: Payer: Self-pay | Admitting: Cardiology

## 2024-03-25 ENCOUNTER — Ambulatory Visit: Payer: Medicare Other | Admitting: Family Medicine

## 2024-04-05 DIAGNOSIS — K080 Exfoliation of teeth due to systemic causes: Secondary | ICD-10-CM | POA: Diagnosis not present

## 2024-04-11 ENCOUNTER — Ambulatory Visit (INDEPENDENT_AMBULATORY_CARE_PROVIDER_SITE_OTHER)

## 2024-04-11 DIAGNOSIS — I639 Cerebral infarction, unspecified: Secondary | ICD-10-CM | POA: Diagnosis not present

## 2024-04-17 LAB — CUP PACEART REMOTE DEVICE CHECK
Date Time Interrogation Session: 20250901002800
Implantable Pulse Generator Implant Date: 20230327
Pulse Gen Serial Number: 177206

## 2024-04-18 NOTE — Progress Notes (Signed)
 [facility] Summary Report / Loop Recorder

## 2024-04-19 NOTE — Progress Notes (Signed)
 Remote Loop Recorder Transmission

## 2024-04-20 ENCOUNTER — Ambulatory Visit: Payer: Self-pay | Admitting: Cardiology

## 2024-05-13 ENCOUNTER — Ambulatory Visit

## 2024-05-13 DIAGNOSIS — I639 Cerebral infarction, unspecified: Secondary | ICD-10-CM

## 2024-05-13 LAB — CUP PACEART REMOTE DEVICE CHECK
Date Time Interrogation Session: 20250929004000
Implantable Pulse Generator Implant Date: 20230327
Pulse Gen Serial Number: 177206

## 2024-05-15 NOTE — Progress Notes (Signed)
 Remote Loop Recorder Transmission

## 2024-05-16 NOTE — Progress Notes (Signed)
 Remote Loop Recorder Transmission

## 2024-05-17 ENCOUNTER — Ambulatory Visit: Payer: Self-pay | Admitting: Cardiology

## 2024-06-12 DIAGNOSIS — F5101 Primary insomnia: Secondary | ICD-10-CM | POA: Diagnosis not present

## 2024-06-13 ENCOUNTER — Ambulatory Visit: Payer: Self-pay | Admitting: Cardiology

## 2024-06-13 ENCOUNTER — Ambulatory Visit

## 2024-06-13 DIAGNOSIS — I639 Cerebral infarction, unspecified: Secondary | ICD-10-CM

## 2024-06-13 LAB — CUP PACEART REMOTE DEVICE CHECK
Date Time Interrogation Session: 20251030004600
Implantable Pulse Generator Implant Date: 20230327
Pulse Gen Serial Number: 177206

## 2024-06-18 NOTE — Progress Notes (Signed)
 Remote Loop Recorder Transmission

## 2024-07-03 ENCOUNTER — Ambulatory Visit: Payer: Self-pay | Admitting: Podiatry

## 2024-07-03 DIAGNOSIS — M722 Plantar fascial fibromatosis: Secondary | ICD-10-CM

## 2024-07-03 NOTE — Progress Notes (Signed)
 Patient presents with complaint of pain of the bottom of feet with walking and standing.  Uses a walker to get around.  Tends be painful on the bottom of the foot.  Pain along the arch and the heel.  Does get some swelling in the lower legs bilaterally.  Says he can walk without the legs getting painful.  Physical exam:  General appearance: Pleasant, and in no acute distress. AOx3.  Vascular: Pedal pulses: DP 1/4 bilaterally, PT 0/4 bilaterally.  Moderate edema lower legs bilaterally. Capillary fill time immediate bilaterally.  Neurological: Light touch intact feet bilaterally.  Normal Achilles reflex bilaterally.  No clonus or spasticity noted.  Negative Tinel sign tarsal tunnel and porta pedis bilaterally  Dermatologic:   And atrophic with no hair growth over the lower extremity.  Hyperpigmentation of the skin bilaterally.  Fat pad extremely atrophic on the plantar aspect of the foot..   Musculoskeletal: Tenderness on the medial edge of the plantar fascia bilaterally.  Some pain and tenderness in the plantar aspect of the heel bilaterally.  Plantar calcaneal tubercles are easily palpable.    Diagnosis: 1.  Plantar fasciitis bilaterally  Plan: -New patient office visit for evaluation and management level 3. - Discussed the plan of fasciitis and pain along the bottom of the foot with walking and standing.  He probably does have some vascular compromise however it does not sound like he has any claudication.  Recommended good stable supportive well cushioned shoes.  Will try some insoles with cushioning and some support.  Also recommend will padded well cushioned socks. -Dispensed OTC foot orthotics bilaterally  Return as needed

## 2024-07-15 ENCOUNTER — Ambulatory Visit

## 2024-07-15 DIAGNOSIS — I639 Cerebral infarction, unspecified: Secondary | ICD-10-CM

## 2024-07-15 LAB — CUP PACEART REMOTE DEVICE CHECK
Date Time Interrogation Session: 20251201015000
Implantable Pulse Generator Implant Date: 20230327
Pulse Gen Serial Number: 177206

## 2024-07-16 ENCOUNTER — Ambulatory Visit: Payer: Self-pay | Admitting: Cardiology

## 2024-07-19 NOTE — Progress Notes (Signed)
 Remote Loop Recorder Transmission

## 2024-08-15 ENCOUNTER — Ambulatory Visit (INDEPENDENT_AMBULATORY_CARE_PROVIDER_SITE_OTHER)

## 2024-08-15 DIAGNOSIS — I639 Cerebral infarction, unspecified: Secondary | ICD-10-CM

## 2024-08-16 LAB — CUP PACEART REMOTE DEVICE CHECK
Date Time Interrogation Session: 20260101035400
Implantable Pulse Generator Implant Date: 20230327
Pulse Gen Serial Number: 177206

## 2024-08-18 ENCOUNTER — Ambulatory Visit: Payer: Self-pay | Admitting: Student in an Organized Health Care Education/Training Program

## 2024-08-20 NOTE — Progress Notes (Signed)
 Remote Loop Recorder Transmission

## 2024-09-16 ENCOUNTER — Ambulatory Visit

## 2024-09-16 DIAGNOSIS — I639 Cerebral infarction, unspecified: Secondary | ICD-10-CM

## 2024-09-17 LAB — CUP PACEART REMOTE DEVICE CHECK
Date Time Interrogation Session: 20260202015800
Implantable Pulse Generator Implant Date: 20230327
Pulse Gen Serial Number: 177206

## 2024-09-20 NOTE — Progress Notes (Signed)
 Remote Loop Recorder Transmission

## 2024-09-26 ENCOUNTER — Ambulatory Visit: Payer: Medicare Other

## 2024-09-30 ENCOUNTER — Ambulatory Visit

## 2024-10-17 ENCOUNTER — Ambulatory Visit

## 2024-11-17 ENCOUNTER — Ambulatory Visit

## 2024-12-18 ENCOUNTER — Ambulatory Visit

## 2025-01-18 ENCOUNTER — Ambulatory Visit

## 2025-02-18 ENCOUNTER — Ambulatory Visit

## 2025-03-21 ENCOUNTER — Ambulatory Visit

## 2025-04-21 ENCOUNTER — Ambulatory Visit

## 2025-05-22 ENCOUNTER — Ambulatory Visit

## 2025-06-22 ENCOUNTER — Ambulatory Visit
